# Patient Record
Sex: Female | Born: 1940
Health system: Southern US, Community
[De-identification: ages and names within clinical notes are randomized; demographics above are authoritative.]

## PROBLEM LIST (undated history)

## (undated) DIAGNOSIS — R0602 Shortness of breath: Secondary | ICD-10-CM

## (undated) DIAGNOSIS — E785 Hyperlipidemia, unspecified: Secondary | ICD-10-CM

## (undated) DIAGNOSIS — G4700 Insomnia, unspecified: Secondary | ICD-10-CM

## (undated) DIAGNOSIS — I10 Essential (primary) hypertension: Secondary | ICD-10-CM

## (undated) DIAGNOSIS — R0989 Other specified symptoms and signs involving the circulatory and respiratory systems: Secondary | ICD-10-CM

## (undated) DIAGNOSIS — R928 Other abnormal and inconclusive findings on diagnostic imaging of breast: Secondary | ICD-10-CM

## (undated) DIAGNOSIS — Z87898 Personal history of other specified conditions: Secondary | ICD-10-CM

## (undated) DIAGNOSIS — K219 Gastro-esophageal reflux disease without esophagitis: Secondary | ICD-10-CM

## (undated) DIAGNOSIS — M47812 Spondylosis without myelopathy or radiculopathy, cervical region: Secondary | ICD-10-CM

## (undated) DIAGNOSIS — N3281 Overactive bladder: Secondary | ICD-10-CM

## (undated) DIAGNOSIS — R12 Heartburn: Secondary | ICD-10-CM

## (undated) DIAGNOSIS — J45909 Unspecified asthma, uncomplicated: Secondary | ICD-10-CM

## (undated) DIAGNOSIS — Z9109 Other allergy status, other than to drugs and biological substances: Secondary | ICD-10-CM

## (undated) HISTORY — DX: Insomnia, unspecified: G47.00

## (undated) HISTORY — DX: Other abnormal and inconclusive findings on diagnostic imaging of breast: R92.8

## (undated) HISTORY — DX: Personal history of other specified conditions: Z87.898

## (undated) HISTORY — DX: Hyperlipidemia, unspecified: E78.5

## (undated) HISTORY — DX: Shortness of breath: R06.02

## (undated) HISTORY — DX: Spondylosis without myelopathy or radiculopathy, cervical region: M47.812

## (undated) HISTORY — PX: CATARACT EXTRACTION W/ INTRAOCULAR LENS  IMPLANT, BILATERAL: SHX1307

## (undated) HISTORY — DX: Essential (primary) hypertension: I10

## (undated) HISTORY — PX: FINGER SURGERY: SHX640

## (undated) HISTORY — DX: Gastro-esophageal reflux disease without esophagitis: K21.9

## (undated) HISTORY — DX: Other specified symptoms and signs involving the circulatory and respiratory systems: R09.89

## (undated) HISTORY — DX: Heartburn: R12

## (undated) HISTORY — PX: NASAL SINUS SURGERY: SHX719

## (undated) HISTORY — PX: COLONOSCOPY: SHX174

## (undated) HISTORY — PX: OTHER SURGICAL HISTORY: SHX169

---

## 1999-05-30 ENCOUNTER — Encounter: Admission: RE | Admit: 1999-05-30 | Discharge: 1999-05-30 | Payer: Self-pay | Admitting: Internal Medicine

## 1999-05-30 ENCOUNTER — Encounter: Payer: Self-pay | Admitting: Internal Medicine

## 2000-05-13 ENCOUNTER — Encounter: Admission: RE | Admit: 2000-05-13 | Discharge: 2000-05-13 | Payer: Self-pay | Admitting: Internal Medicine

## 2000-05-13 ENCOUNTER — Encounter: Payer: Self-pay | Admitting: Internal Medicine

## 2001-05-14 ENCOUNTER — Encounter: Admission: RE | Admit: 2001-05-14 | Discharge: 2001-05-14 | Payer: Self-pay | Admitting: Internal Medicine

## 2001-05-14 ENCOUNTER — Encounter: Payer: Self-pay | Admitting: Internal Medicine

## 2001-05-26 DIAGNOSIS — G459 Transient cerebral ischemic attack, unspecified: Secondary | ICD-10-CM

## 2001-05-26 HISTORY — DX: Transient cerebral ischemic attack, unspecified: G45.9

## 2002-09-28 ENCOUNTER — Encounter: Payer: Self-pay | Admitting: Internal Medicine

## 2002-09-28 ENCOUNTER — Encounter: Admission: RE | Admit: 2002-09-28 | Discharge: 2002-09-28 | Payer: Self-pay | Admitting: Internal Medicine

## 2005-03-20 ENCOUNTER — Encounter (HOSPITAL_COMMUNITY): Admission: RE | Admit: 2005-03-20 | Discharge: 2005-03-25 | Payer: Self-pay | Admitting: *Deleted

## 2009-05-26 DIAGNOSIS — R928 Other abnormal and inconclusive findings on diagnostic imaging of breast: Secondary | ICD-10-CM

## 2009-05-26 HISTORY — DX: Other abnormal and inconclusive findings on diagnostic imaging of breast: R92.8

## 2011-07-15 DIAGNOSIS — E559 Vitamin D deficiency, unspecified: Secondary | ICD-10-CM | POA: Diagnosis not present

## 2011-07-15 DIAGNOSIS — Z0000 Encounter for general adult medical examination without abnormal findings: Secondary | ICD-10-CM | POA: Diagnosis not present

## 2011-07-15 DIAGNOSIS — N949 Unspecified condition associated with female genital organs and menstrual cycle: Secondary | ICD-10-CM | POA: Diagnosis not present

## 2011-07-15 DIAGNOSIS — E782 Mixed hyperlipidemia: Secondary | ICD-10-CM | POA: Diagnosis not present

## 2011-07-15 DIAGNOSIS — K219 Gastro-esophageal reflux disease without esophagitis: Secondary | ICD-10-CM | POA: Diagnosis not present

## 2011-07-15 DIAGNOSIS — G459 Transient cerebral ischemic attack, unspecified: Secondary | ICD-10-CM | POA: Diagnosis not present

## 2011-07-15 DIAGNOSIS — K5900 Constipation, unspecified: Secondary | ICD-10-CM | POA: Diagnosis not present

## 2011-07-15 DIAGNOSIS — I10 Essential (primary) hypertension: Secondary | ICD-10-CM | POA: Diagnosis not present

## 2011-07-15 DIAGNOSIS — Z79899 Other long term (current) drug therapy: Secondary | ICD-10-CM | POA: Diagnosis not present

## 2011-09-26 DIAGNOSIS — H40019 Open angle with borderline findings, low risk, unspecified eye: Secondary | ICD-10-CM | POA: Diagnosis not present

## 2011-09-26 DIAGNOSIS — H2589 Other age-related cataract: Secondary | ICD-10-CM | POA: Diagnosis not present

## 2011-10-02 DIAGNOSIS — N649 Disorder of breast, unspecified: Secondary | ICD-10-CM | POA: Diagnosis not present

## 2011-10-27 DIAGNOSIS — Z124 Encounter for screening for malignant neoplasm of cervix: Secondary | ICD-10-CM | POA: Diagnosis not present

## 2011-10-27 DIAGNOSIS — W57XXXA Bitten or stung by nonvenomous insect and other nonvenomous arthropods, initial encounter: Secondary | ICD-10-CM | POA: Diagnosis not present

## 2011-10-27 DIAGNOSIS — N951 Menopausal and female climacteric states: Secondary | ICD-10-CM | POA: Diagnosis not present

## 2011-10-27 DIAGNOSIS — Z1212 Encounter for screening for malignant neoplasm of rectum: Secondary | ICD-10-CM | POA: Diagnosis not present

## 2011-10-27 DIAGNOSIS — T148 Other injury of unspecified body region: Secondary | ICD-10-CM | POA: Diagnosis not present

## 2011-10-27 DIAGNOSIS — N8111 Cystocele, midline: Secondary | ICD-10-CM | POA: Diagnosis not present

## 2011-12-17 DIAGNOSIS — H2589 Other age-related cataract: Secondary | ICD-10-CM | POA: Diagnosis not present

## 2011-12-25 DIAGNOSIS — H40009 Preglaucoma, unspecified, unspecified eye: Secondary | ICD-10-CM | POA: Diagnosis not present

## 2011-12-25 DIAGNOSIS — Z8673 Personal history of transient ischemic attack (TIA), and cerebral infarction without residual deficits: Secondary | ICD-10-CM | POA: Diagnosis not present

## 2011-12-25 DIAGNOSIS — H269 Unspecified cataract: Secondary | ICD-10-CM | POA: Diagnosis not present

## 2011-12-25 DIAGNOSIS — I10 Essential (primary) hypertension: Secondary | ICD-10-CM | POA: Diagnosis not present

## 2011-12-25 DIAGNOSIS — H5210 Myopia, unspecified eye: Secondary | ICD-10-CM | POA: Diagnosis not present

## 2011-12-25 DIAGNOSIS — H52209 Unspecified astigmatism, unspecified eye: Secondary | ICD-10-CM | POA: Diagnosis not present

## 2011-12-25 DIAGNOSIS — H2589 Other age-related cataract: Secondary | ICD-10-CM | POA: Diagnosis not present

## 2012-01-02 DIAGNOSIS — H2589 Other age-related cataract: Secondary | ICD-10-CM | POA: Diagnosis not present

## 2012-01-08 DIAGNOSIS — H2589 Other age-related cataract: Secondary | ICD-10-CM | POA: Diagnosis not present

## 2012-01-08 DIAGNOSIS — H269 Unspecified cataract: Secondary | ICD-10-CM | POA: Diagnosis not present

## 2012-01-08 DIAGNOSIS — Z961 Presence of intraocular lens: Secondary | ICD-10-CM | POA: Diagnosis not present

## 2012-01-08 DIAGNOSIS — E785 Hyperlipidemia, unspecified: Secondary | ICD-10-CM | POA: Diagnosis not present

## 2012-01-08 DIAGNOSIS — I10 Essential (primary) hypertension: Secondary | ICD-10-CM | POA: Diagnosis not present

## 2012-01-08 DIAGNOSIS — Z9849 Cataract extraction status, unspecified eye: Secondary | ICD-10-CM | POA: Diagnosis not present

## 2012-01-08 DIAGNOSIS — Z8673 Personal history of transient ischemic attack (TIA), and cerebral infarction without residual deficits: Secondary | ICD-10-CM | POA: Diagnosis not present

## 2012-02-01 DIAGNOSIS — W010XXA Fall on same level from slipping, tripping and stumbling without subsequent striking against object, initial encounter: Secondary | ICD-10-CM | POA: Diagnosis not present

## 2012-02-01 DIAGNOSIS — S42309A Unspecified fracture of shaft of humerus, unspecified arm, initial encounter for closed fracture: Secondary | ICD-10-CM | POA: Diagnosis not present

## 2012-02-01 DIAGNOSIS — S42209A Unspecified fracture of upper end of unspecified humerus, initial encounter for closed fracture: Secondary | ICD-10-CM | POA: Diagnosis not present

## 2012-02-01 DIAGNOSIS — S4980XA Other specified injuries of shoulder and upper arm, unspecified arm, initial encounter: Secondary | ICD-10-CM | POA: Diagnosis not present

## 2012-02-02 DIAGNOSIS — S42209A Unspecified fracture of upper end of unspecified humerus, initial encounter for closed fracture: Secondary | ICD-10-CM | POA: Diagnosis not present

## 2012-02-06 DIAGNOSIS — M25529 Pain in unspecified elbow: Secondary | ICD-10-CM | POA: Diagnosis not present

## 2012-02-06 DIAGNOSIS — S42209A Unspecified fracture of upper end of unspecified humerus, initial encounter for closed fracture: Secondary | ICD-10-CM | POA: Diagnosis not present

## 2012-02-11 DIAGNOSIS — M25529 Pain in unspecified elbow: Secondary | ICD-10-CM | POA: Diagnosis not present

## 2012-02-13 DIAGNOSIS — M25529 Pain in unspecified elbow: Secondary | ICD-10-CM | POA: Diagnosis not present

## 2012-02-17 DIAGNOSIS — M25529 Pain in unspecified elbow: Secondary | ICD-10-CM | POA: Diagnosis not present

## 2012-02-17 DIAGNOSIS — S42209A Unspecified fracture of upper end of unspecified humerus, initial encounter for closed fracture: Secondary | ICD-10-CM | POA: Diagnosis not present

## 2012-02-19 DIAGNOSIS — M25529 Pain in unspecified elbow: Secondary | ICD-10-CM | POA: Diagnosis not present

## 2012-02-24 DIAGNOSIS — M25529 Pain in unspecified elbow: Secondary | ICD-10-CM | POA: Diagnosis not present

## 2012-02-26 DIAGNOSIS — M25529 Pain in unspecified elbow: Secondary | ICD-10-CM | POA: Diagnosis not present

## 2012-03-02 DIAGNOSIS — M25529 Pain in unspecified elbow: Secondary | ICD-10-CM | POA: Diagnosis not present

## 2012-03-04 DIAGNOSIS — M25529 Pain in unspecified elbow: Secondary | ICD-10-CM | POA: Diagnosis not present

## 2012-03-09 DIAGNOSIS — M25529 Pain in unspecified elbow: Secondary | ICD-10-CM | POA: Diagnosis not present

## 2012-03-12 DIAGNOSIS — M25529 Pain in unspecified elbow: Secondary | ICD-10-CM | POA: Diagnosis not present

## 2012-03-15 DIAGNOSIS — S42213A Unspecified displaced fracture of surgical neck of unspecified humerus, initial encounter for closed fracture: Secondary | ICD-10-CM | POA: Diagnosis not present

## 2012-03-16 DIAGNOSIS — M25529 Pain in unspecified elbow: Secondary | ICD-10-CM | POA: Diagnosis not present

## 2012-03-19 DIAGNOSIS — M25529 Pain in unspecified elbow: Secondary | ICD-10-CM | POA: Diagnosis not present

## 2012-03-23 DIAGNOSIS — M25529 Pain in unspecified elbow: Secondary | ICD-10-CM | POA: Diagnosis not present

## 2012-03-24 DIAGNOSIS — M25529 Pain in unspecified elbow: Secondary | ICD-10-CM | POA: Diagnosis not present

## 2012-03-26 DIAGNOSIS — M25529 Pain in unspecified elbow: Secondary | ICD-10-CM | POA: Diagnosis not present

## 2012-03-29 DIAGNOSIS — M25529 Pain in unspecified elbow: Secondary | ICD-10-CM | POA: Diagnosis not present

## 2012-03-31 DIAGNOSIS — S42213A Unspecified displaced fracture of surgical neck of unspecified humerus, initial encounter for closed fracture: Secondary | ICD-10-CM | POA: Diagnosis not present

## 2012-03-31 DIAGNOSIS — M25529 Pain in unspecified elbow: Secondary | ICD-10-CM | POA: Diagnosis not present

## 2012-04-02 DIAGNOSIS — S42213A Unspecified displaced fracture of surgical neck of unspecified humerus, initial encounter for closed fracture: Secondary | ICD-10-CM | POA: Diagnosis not present

## 2012-04-02 DIAGNOSIS — M25529 Pain in unspecified elbow: Secondary | ICD-10-CM | POA: Diagnosis not present

## 2012-04-05 DIAGNOSIS — S42213A Unspecified displaced fracture of surgical neck of unspecified humerus, initial encounter for closed fracture: Secondary | ICD-10-CM | POA: Diagnosis not present

## 2012-04-05 DIAGNOSIS — M25529 Pain in unspecified elbow: Secondary | ICD-10-CM | POA: Diagnosis not present

## 2012-04-06 DIAGNOSIS — M25529 Pain in unspecified elbow: Secondary | ICD-10-CM | POA: Diagnosis not present

## 2012-04-06 DIAGNOSIS — S42213A Unspecified displaced fracture of surgical neck of unspecified humerus, initial encounter for closed fracture: Secondary | ICD-10-CM | POA: Diagnosis not present

## 2012-04-06 DIAGNOSIS — N63 Unspecified lump in unspecified breast: Secondary | ICD-10-CM | POA: Diagnosis not present

## 2012-04-08 DIAGNOSIS — S42213A Unspecified displaced fracture of surgical neck of unspecified humerus, initial encounter for closed fracture: Secondary | ICD-10-CM | POA: Diagnosis not present

## 2012-04-08 DIAGNOSIS — M25529 Pain in unspecified elbow: Secondary | ICD-10-CM | POA: Diagnosis not present

## 2012-04-12 DIAGNOSIS — R928 Other abnormal and inconclusive findings on diagnostic imaging of breast: Secondary | ICD-10-CM | POA: Diagnosis not present

## 2012-04-12 DIAGNOSIS — N63 Unspecified lump in unspecified breast: Secondary | ICD-10-CM | POA: Diagnosis not present

## 2012-04-12 DIAGNOSIS — S42213A Unspecified displaced fracture of surgical neck of unspecified humerus, initial encounter for closed fracture: Secondary | ICD-10-CM | POA: Diagnosis not present

## 2012-04-12 DIAGNOSIS — Z803 Family history of malignant neoplasm of breast: Secondary | ICD-10-CM | POA: Diagnosis not present

## 2012-04-12 DIAGNOSIS — M25529 Pain in unspecified elbow: Secondary | ICD-10-CM | POA: Diagnosis not present

## 2012-04-14 DIAGNOSIS — M25529 Pain in unspecified elbow: Secondary | ICD-10-CM | POA: Diagnosis not present

## 2012-04-14 DIAGNOSIS — S42213A Unspecified displaced fracture of surgical neck of unspecified humerus, initial encounter for closed fracture: Secondary | ICD-10-CM | POA: Diagnosis not present

## 2012-04-16 DIAGNOSIS — S42213A Unspecified displaced fracture of surgical neck of unspecified humerus, initial encounter for closed fracture: Secondary | ICD-10-CM | POA: Diagnosis not present

## 2012-04-16 DIAGNOSIS — Z23 Encounter for immunization: Secondary | ICD-10-CM | POA: Diagnosis not present

## 2012-04-16 DIAGNOSIS — M25529 Pain in unspecified elbow: Secondary | ICD-10-CM | POA: Diagnosis not present

## 2012-04-19 DIAGNOSIS — S42209A Unspecified fracture of upper end of unspecified humerus, initial encounter for closed fracture: Secondary | ICD-10-CM | POA: Diagnosis not present

## 2012-04-20 DIAGNOSIS — M25529 Pain in unspecified elbow: Secondary | ICD-10-CM | POA: Diagnosis not present

## 2012-04-20 DIAGNOSIS — S42213A Unspecified displaced fracture of surgical neck of unspecified humerus, initial encounter for closed fracture: Secondary | ICD-10-CM | POA: Diagnosis not present

## 2012-04-27 DIAGNOSIS — M25529 Pain in unspecified elbow: Secondary | ICD-10-CM | POA: Diagnosis not present

## 2012-04-27 DIAGNOSIS — S42213A Unspecified displaced fracture of surgical neck of unspecified humerus, initial encounter for closed fracture: Secondary | ICD-10-CM | POA: Diagnosis not present

## 2012-04-29 DIAGNOSIS — S42213A Unspecified displaced fracture of surgical neck of unspecified humerus, initial encounter for closed fracture: Secondary | ICD-10-CM | POA: Diagnosis not present

## 2012-04-29 DIAGNOSIS — M25529 Pain in unspecified elbow: Secondary | ICD-10-CM | POA: Diagnosis not present

## 2012-05-04 DIAGNOSIS — S42213A Unspecified displaced fracture of surgical neck of unspecified humerus, initial encounter for closed fracture: Secondary | ICD-10-CM | POA: Diagnosis not present

## 2012-05-04 DIAGNOSIS — M25529 Pain in unspecified elbow: Secondary | ICD-10-CM | POA: Diagnosis not present

## 2012-05-06 DIAGNOSIS — S42213A Unspecified displaced fracture of surgical neck of unspecified humerus, initial encounter for closed fracture: Secondary | ICD-10-CM | POA: Diagnosis not present

## 2012-05-06 DIAGNOSIS — M25529 Pain in unspecified elbow: Secondary | ICD-10-CM | POA: Diagnosis not present

## 2012-05-11 DIAGNOSIS — M25529 Pain in unspecified elbow: Secondary | ICD-10-CM | POA: Diagnosis not present

## 2012-05-11 DIAGNOSIS — S42213A Unspecified displaced fracture of surgical neck of unspecified humerus, initial encounter for closed fracture: Secondary | ICD-10-CM | POA: Diagnosis not present

## 2012-05-14 DIAGNOSIS — M25529 Pain in unspecified elbow: Secondary | ICD-10-CM | POA: Diagnosis not present

## 2012-05-14 DIAGNOSIS — S42213A Unspecified displaced fracture of surgical neck of unspecified humerus, initial encounter for closed fracture: Secondary | ICD-10-CM | POA: Diagnosis not present

## 2012-06-09 DIAGNOSIS — Z961 Presence of intraocular lens: Secondary | ICD-10-CM | POA: Diagnosis not present

## 2012-07-16 ENCOUNTER — Other Ambulatory Visit: Payer: Self-pay | Admitting: Internal Medicine

## 2012-07-16 DIAGNOSIS — R439 Unspecified disturbances of smell and taste: Secondary | ICD-10-CM | POA: Diagnosis not present

## 2012-07-16 DIAGNOSIS — Z0000 Encounter for general adult medical examination without abnormal findings: Secondary | ICD-10-CM | POA: Diagnosis not present

## 2012-07-16 DIAGNOSIS — R1084 Generalized abdominal pain: Secondary | ICD-10-CM | POA: Diagnosis not present

## 2012-07-16 DIAGNOSIS — Z79899 Other long term (current) drug therapy: Secondary | ICD-10-CM | POA: Diagnosis not present

## 2012-07-16 DIAGNOSIS — E782 Mixed hyperlipidemia: Secondary | ICD-10-CM | POA: Diagnosis not present

## 2012-07-16 DIAGNOSIS — K5900 Constipation, unspecified: Secondary | ICD-10-CM | POA: Diagnosis not present

## 2012-07-16 DIAGNOSIS — Z1331 Encounter for screening for depression: Secondary | ICD-10-CM | POA: Diagnosis not present

## 2012-07-16 DIAGNOSIS — I10 Essential (primary) hypertension: Secondary | ICD-10-CM | POA: Diagnosis not present

## 2012-07-16 DIAGNOSIS — E559 Vitamin D deficiency, unspecified: Secondary | ICD-10-CM | POA: Diagnosis not present

## 2012-07-16 DIAGNOSIS — G459 Transient cerebral ischemic attack, unspecified: Secondary | ICD-10-CM | POA: Diagnosis not present

## 2012-07-21 ENCOUNTER — Ambulatory Visit
Admission: RE | Admit: 2012-07-21 | Discharge: 2012-07-21 | Disposition: A | Discharge status: Home / Self Care | Payer: Medicare Other | Source: Ambulatory Visit | Attending: Internal Medicine | Admitting: Internal Medicine

## 2012-07-21 DIAGNOSIS — R109 Unspecified abdominal pain: Secondary | ICD-10-CM | POA: Diagnosis not present

## 2012-07-21 MED ORDER — IOHEXOL 300 MG/ML  SOLN
100.0000 mL | Freq: Once | INTRAMUSCULAR | Status: AC | PRN
  Administered 2012-07-21: 100 mL via INTRAVENOUS

## 2012-08-06 DIAGNOSIS — S42213A Unspecified displaced fracture of surgical neck of unspecified humerus, initial encounter for closed fracture: Secondary | ICD-10-CM | POA: Diagnosis not present

## 2012-08-16 DIAGNOSIS — R12 Heartburn: Secondary | ICD-10-CM | POA: Diagnosis not present

## 2012-08-16 DIAGNOSIS — I10 Essential (primary) hypertension: Secondary | ICD-10-CM | POA: Diagnosis not present

## 2012-08-16 DIAGNOSIS — R109 Unspecified abdominal pain: Secondary | ICD-10-CM | POA: Diagnosis not present

## 2012-08-31 DIAGNOSIS — D485 Neoplasm of uncertain behavior of skin: Secondary | ICD-10-CM | POA: Diagnosis not present

## 2012-09-14 DIAGNOSIS — D485 Neoplasm of uncertain behavior of skin: Secondary | ICD-10-CM | POA: Diagnosis not present

## 2012-10-01 DIAGNOSIS — D485 Neoplasm of uncertain behavior of skin: Secondary | ICD-10-CM | POA: Diagnosis not present

## 2012-11-04 DIAGNOSIS — G4700 Insomnia, unspecified: Secondary | ICD-10-CM | POA: Diagnosis not present

## 2012-11-04 DIAGNOSIS — Z13820 Encounter for screening for osteoporosis: Secondary | ICD-10-CM | POA: Diagnosis not present

## 2012-11-04 DIAGNOSIS — Z1212 Encounter for screening for malignant neoplasm of rectum: Secondary | ICD-10-CM | POA: Diagnosis not present

## 2012-11-04 DIAGNOSIS — L708 Other acne: Secondary | ICD-10-CM | POA: Diagnosis not present

## 2012-11-04 DIAGNOSIS — N951 Menopausal and female climacteric states: Secondary | ICD-10-CM | POA: Diagnosis not present

## 2012-11-04 DIAGNOSIS — N8111 Cystocele, midline: Secondary | ICD-10-CM | POA: Diagnosis not present

## 2012-11-04 DIAGNOSIS — Z01419 Encounter for gynecological examination (general) (routine) without abnormal findings: Secondary | ICD-10-CM | POA: Diagnosis not present

## 2013-03-09 DIAGNOSIS — Z23 Encounter for immunization: Secondary | ICD-10-CM | POA: Diagnosis not present

## 2013-05-23 DIAGNOSIS — J3489 Other specified disorders of nose and nasal sinuses: Secondary | ICD-10-CM | POA: Diagnosis not present

## 2013-05-23 DIAGNOSIS — H6120 Impacted cerumen, unspecified ear: Secondary | ICD-10-CM | POA: Diagnosis not present

## 2013-05-23 DIAGNOSIS — Z1231 Encounter for screening mammogram for malignant neoplasm of breast: Secondary | ICD-10-CM | POA: Diagnosis not present

## 2013-05-26 DIAGNOSIS — R0989 Other specified symptoms and signs involving the circulatory and respiratory systems: Secondary | ICD-10-CM

## 2013-05-26 HISTORY — DX: Other specified symptoms and signs involving the circulatory and respiratory systems: R09.89

## 2013-06-10 DIAGNOSIS — H40019 Open angle with borderline findings, low risk, unspecified eye: Secondary | ICD-10-CM | POA: Diagnosis not present

## 2013-06-10 DIAGNOSIS — B399 Histoplasmosis, unspecified: Secondary | ICD-10-CM | POA: Diagnosis not present

## 2013-06-10 DIAGNOSIS — H04129 Dry eye syndrome of unspecified lacrimal gland: Secondary | ICD-10-CM | POA: Diagnosis not present

## 2013-07-18 ENCOUNTER — Other Ambulatory Visit: Payer: Self-pay | Admitting: Internal Medicine

## 2013-07-18 DIAGNOSIS — R0989 Other specified symptoms and signs involving the circulatory and respiratory systems: Secondary | ICD-10-CM | POA: Diagnosis not present

## 2013-07-18 DIAGNOSIS — E782 Mixed hyperlipidemia: Secondary | ICD-10-CM | POA: Diagnosis not present

## 2013-07-18 DIAGNOSIS — E559 Vitamin D deficiency, unspecified: Secondary | ICD-10-CM | POA: Diagnosis not present

## 2013-07-18 DIAGNOSIS — Z1331 Encounter for screening for depression: Secondary | ICD-10-CM | POA: Diagnosis not present

## 2013-07-18 DIAGNOSIS — Z0000 Encounter for general adult medical examination without abnormal findings: Secondary | ICD-10-CM

## 2013-07-18 DIAGNOSIS — R12 Heartburn: Secondary | ICD-10-CM | POA: Diagnosis not present

## 2013-07-18 DIAGNOSIS — Z79899 Other long term (current) drug therapy: Secondary | ICD-10-CM | POA: Diagnosis not present

## 2013-07-18 DIAGNOSIS — I10 Essential (primary) hypertension: Secondary | ICD-10-CM | POA: Diagnosis not present

## 2013-07-18 DIAGNOSIS — R439 Unspecified disturbances of smell and taste: Secondary | ICD-10-CM | POA: Diagnosis not present

## 2013-07-22 ENCOUNTER — Other Ambulatory Visit: Payer: Medicare Other

## 2013-07-27 ENCOUNTER — Ambulatory Visit
Admission: RE | Admit: 2013-07-27 | Discharge: 2013-07-27 | Disposition: A | Discharge status: Home / Self Care | Payer: Medicare Other | Source: Ambulatory Visit | Attending: Internal Medicine | Admitting: Internal Medicine

## 2013-07-27 DIAGNOSIS — Z0000 Encounter for general adult medical examination without abnormal findings: Secondary | ICD-10-CM

## 2013-07-27 DIAGNOSIS — I658 Occlusion and stenosis of other precerebral arteries: Secondary | ICD-10-CM | POA: Diagnosis not present

## 2013-11-14 ENCOUNTER — Ambulatory Visit (INDEPENDENT_AMBULATORY_CARE_PROVIDER_SITE_OTHER): Payer: Medicare Other | Admitting: Cardiology

## 2013-11-14 ENCOUNTER — Encounter: Payer: Self-pay | Admitting: Cardiology

## 2013-11-14 VITALS — BP 131/70 | HR 56 | Ht 65.0 in | Wt 175.4 lb

## 2013-11-14 DIAGNOSIS — E785 Hyperlipidemia, unspecified: Secondary | ICD-10-CM | POA: Diagnosis not present

## 2013-11-14 DIAGNOSIS — R0602 Shortness of breath: Secondary | ICD-10-CM | POA: Diagnosis not present

## 2013-11-14 DIAGNOSIS — IMO0001 Reserved for inherently not codable concepts without codable children: Secondary | ICD-10-CM | POA: Diagnosis not present

## 2013-11-14 DIAGNOSIS — Z124 Encounter for screening for malignant neoplasm of cervix: Secondary | ICD-10-CM | POA: Diagnosis not present

## 2013-11-14 DIAGNOSIS — N3941 Urge incontinence: Secondary | ICD-10-CM | POA: Diagnosis not present

## 2013-11-14 DIAGNOSIS — I10 Essential (primary) hypertension: Secondary | ICD-10-CM | POA: Diagnosis not present

## 2013-11-14 DIAGNOSIS — Z1212 Encounter for screening for malignant neoplasm of rectum: Secondary | ICD-10-CM | POA: Diagnosis not present

## 2013-11-14 DIAGNOSIS — N951 Menopausal and female climacteric states: Secondary | ICD-10-CM | POA: Diagnosis not present

## 2013-11-14 DIAGNOSIS — I208 Other forms of angina pectoris: Secondary | ICD-10-CM

## 2013-11-14 DIAGNOSIS — R0989 Other specified symptoms and signs involving the circulatory and respiratory systems: Secondary | ICD-10-CM | POA: Diagnosis not present

## 2013-11-14 DIAGNOSIS — R6889 Other general symptoms and signs: Secondary | ICD-10-CM | POA: Diagnosis not present

## 2013-11-14 DIAGNOSIS — R0609 Other forms of dyspnea: Secondary | ICD-10-CM

## 2013-11-14 DIAGNOSIS — R0600 Dyspnea, unspecified: Secondary | ICD-10-CM

## 2013-11-14 MED ORDER — ATORVASTATIN CALCIUM 40 MG PO TABS: 40.0000 mg | ORAL_TABLET | Freq: Every day | ORAL | Status: DC

## 2013-11-14 NOTE — Progress Notes (Signed)
[facility]. [address], [place]  [address] Phone: [phone] Fax:  [phone]  Date:  11/14/2013   ID:  Holly Cruz, DOB Oct 22, 1940, MRN [id number]  PCP:  No primary provider on file.   History of Present Illness: Holly Cruz is a 73 y.o. female patient of Dr. Inda Merlin with new onset shortness of breath, decreased exercise tolerance and symptoms concerning for possible angina. In 2006 she underwent nuclear stress test that was low risk, no ischemia. She has over the past month been experiencing increasing dyspnea, decreased exercise tolerance, worsening fatigue. After the gym had no energy. Weak. Walk across the floor weak. Friday night her left arm hurt really bad, pain, upper arm, 4 hours, diaphoresis. Near the end radiated to right arm. Went away. Last night at 11pm, both arms hurt. Pain. 2 hours.  In fact, she was at her gynecology appointment this morning.  This concerned her gynecologist enough to notify Dr. Inda Merlin.   Wt Readings from Last 3 Encounters:  11/14/13 175 lb 6.4 oz (79.561 kg)     Past Medical History  Diagnosis Date  . Hyperlipidemia   . Hypertension   . GERD (gastroesophageal reflux disease)   . DJD (degenerative joint disease), cervical     severe muscle spasm  . SOB (shortness of breath)     in past with Neg. cardiac workup with normal myocardial perfusion scan in 02-2005 per Dr. Tamala Julian.  . Insomnia   . Abnormal mammogram 2011  . Hx of abdominal pain     as per history of present illness gross hematuria with negative workup per Dr. Risa Grill 2010- no resurrence  through January 2001  . Heart burn   . Left carotid bruit 2015    Past Surgical History  Procedure Laterality Date  . Finger surgery      Right 3rd  and 4th finger- trigger finger release  . Carotid doppler      revealed a less than 50% stenosis on the right, none on the left 07-27-2013    Current Outpatient Prescriptions  Medication Sig Dispense Refill  . amLODipine  (NORVASC) 5 MG tablet Take 5 mg by mouth daily.      Marland Kitchen aspirin 325 MG tablet Take 160 mg by mouth daily.       Marland Kitchen atenolol (TENORMIN) 50 MG tablet Take 50 mg by mouth daily.      . Cholecalciferol (VITAMIN D PO) Take 2,000 mg by mouth daily.      . Multiple Vitamin (MULTIVITAMIN) capsule Take 1 capsule by mouth daily.      Marland Kitchen omeprazole (PRILOSEC) 20 MG capsule Take 20 mg by mouth as needed.       . simvastatin (ZOCOR) 80 MG tablet Take 80 mg by mouth daily.      Marland Kitchen telmisartan-hydrochlorothiazide (MICARDIS HCT) 80-12.5 MG per tablet Take 1 tablet by mouth daily.      . zaleplon (SONATA) 10 MG capsule Take 10 mg by mouth 2 (two) times daily as needed for sleep (for Insomnia).       No current facility-administered medications for this visit.    Allergies:    Allergies  Allergen Reactions  . Codeine     Nausea and vomiting  . Naprosyn [Naproxen]     Some form of severe reaction  . Sulfa Antibiotics     rash  . Tetanus Toxoids     Rash    Social History:  The patient  reports that she has never smoked. She does not have any smokeless tobacco history on file. 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   Family History  Problem Relation Age of Onset  . Hypertension Mother   . Heart disease Mother   . Prostate cancer Father   . Hypertension Father    Mother CABG at 58. Father died of CVA.   ROS:  Please see the history of present illness.   Denies any fevers, chills, orthopnea, PND, syncope, bleeding, orthopnea.  All other systems reviewed and negative.   PHYSICAL EXAM: VS:  BP 131/70  Pulse 56  Ht 5\' 5"  (1.651 m)  Wt 175 lb 6.4 oz (79.561 kg)  BMI 29.19 kg/m2 Well nourished, well developed, in no acute distress HEENT: normal, [place]/AT, EOMI Neck: no JVD, normal carotid upstroke, no bruit Cardiac:  normal S1, S2; RRR; no murmur Lungs:  clear to auscultation bilaterally, no wheezing, rhonchi or rales Abd: soft, nontender, no hepatomegaly, no bruits Ext: no edema,  2+ distal pulses Skin: warm and dry GU: deferred Neuro: no focal abnormalities noted, AAO x 3  EKG:  11/14/13-sinus bradycardia 54with nonspecific ST-T wave changes  ASSESSMENT AND PLAN:  1. Dyspnea/angina-possible anginal equivalent. With associated jaw pain, we will go ahead and order a nuclear stress test to further evaluate for ischemia. Dr. Inda Merlin today has ordered lab work including troponin, d-dimer and will followup with this. TSH was also performed. Differential diagnosis includes musculoskeletal, possible ischemia. 2. Hyperlipidemia-given her concomitant use of simvastatin 80 and amlodipine, I will change her simvastatin over to atorvastatin 40 mg. She should tolerate well. 3. Hypertension-on multidrug regimen. 4. I will followup with testing  Signed, Candee Furbish, MD [facility]  11/14/2013 2:52 PM

## 2013-11-14 NOTE — Patient Instructions (Signed)
STOP SIMVASTATIN   START ATORVASTATIN 40 MG DAILY   Your physician has requested that you have en exercise stress myoview. For further information please visit [email or web address]. Please follow instruction sheet, as given.

## 2013-11-16 ENCOUNTER — Ambulatory Visit (HOSPITAL_COMMUNITY): Payer: Medicare Other | Attending: Cardiology | Admitting: Radiology

## 2013-11-16 VITALS — BP 159/73 | HR 65 | Ht 65.0 in | Wt 178.0 lb

## 2013-11-16 DIAGNOSIS — R079 Chest pain, unspecified: Secondary | ICD-10-CM | POA: Diagnosis not present

## 2013-11-16 DIAGNOSIS — R0600 Dyspnea, unspecified: Secondary | ICD-10-CM

## 2013-11-16 DIAGNOSIS — R5381 Other malaise: Secondary | ICD-10-CM | POA: Insufficient documentation

## 2013-11-16 DIAGNOSIS — R0602 Shortness of breath: Secondary | ICD-10-CM | POA: Diagnosis not present

## 2013-11-16 DIAGNOSIS — R61 Generalized hyperhidrosis: Secondary | ICD-10-CM | POA: Insufficient documentation

## 2013-11-16 DIAGNOSIS — M79609 Pain in unspecified limb: Secondary | ICD-10-CM | POA: Insufficient documentation

## 2013-11-16 DIAGNOSIS — R5383 Other fatigue: Principal | ICD-10-CM

## 2013-11-16 MED ORDER — TECHNETIUM TC 99M SESTAMIBI GENERIC - CARDIOLITE
11.0000 | Freq: Once | INTRAVENOUS | Status: AC | PRN
  Administered 2013-11-16: 11 via INTRAVENOUS

## 2013-11-16 MED ORDER — TECHNETIUM TC 99M SESTAMIBI GENERIC - CARDIOLITE
33.0000 | Freq: Once | INTRAVENOUS | Status: AC | PRN
  Administered 2013-11-16: 33 via INTRAVENOUS

## 2013-11-16 NOTE — Progress Notes (Signed)
[facility] [place], [place] [address] [phone]    Cardiology Nuclear Med Study  Holly Cruz is a 73 y.o. female     MRN : [id number]     DOB: Nov 27, 1940  Procedure Date: 11/16/2013  Nuclear Med Background Indication for Stress Test:  Evaluation for Ischemia and Abnormal EKG History:  No known CAD, MPI 2006 (low risk) Cardiac Risk Factors: Carotid Disease, Family History - CAD, Hypertension, Lipids and TIA  Symptoms:  Fatigue, SOB and left arm pain with diaphoresis   Nuclear Pre-Procedure Caffeine/Decaff Intake:  None NPO After: 7:00am   Lungs:  clear O2 Sat: 98% on room air. IV 0.9% NS with Angio Cath:  22g  IV Site: R Antecubital  IV Started by:  Crissie Figures, RN  Chest Size (in):  38 Cup Size: C  Height: 5\' 5"  (1.651 m)  Weight:  178 lb (80.74 kg)  BMI:  Body mass index is 29.62 kg/(m^2). Tech Comments:  Atenolol held x 24 hrs    Nuclear Med Study 1 or 2 day study: 1 day  Stress Test Type:  Stress  Reading MD: N/A  Order Authorizing Provider:  Candee Furbish, MD  Resting Radionuclide: Technetium 61m Sestamibi  Resting Radionuclide Dose: 11.0 mCi   Stress Radionuclide:  Technetium 71m Sestamibi  Stress Radionuclide Dose: 33.0 mCi           Stress Protocol Rest HR: 65 Stress HR: 134  Rest BP: 159/73 Stress BP: 204/78  Exercise Time (min): 6:00 METS: 7.0           Dose of Adenosine (mg):  n/a Dose of Lexiscan: n/a mg  Dose of Atropine (mg): n/a Dose of Dobutamine: n/a mcg/kg/min (at max HR)  Stress Test Technologist: Glade Lloyd, BS-ES  Nuclear Technologist:  Charlton Amor, CNMT     Rest Procedure:  Myocardial perfusion imaging was performed at rest 45 minutes following the intravenous administration of Technetium 34m Sestamibi. Rest ECG: NSR - Normal EKG  Stress Procedure:  The patient exercised on the treadmill utilizing the Bruce Protocol for 6:00 minutes. The patient stopped due to fatigue, SOB and denied  any chest pain.  Technetium 65m Sestamibi was injected at peak exercise and myocardial perfusion imaging was performed after a brief delay. Stress ECG: No significant ST segment change suggestive of ischemia.  QPS Raw Data Images:  Normal; no motion artifact; normal heart/lung ratio. Stress Images:  There is decreased uptake in the lateral wall. Rest Images:  Decreased uptake in the lateral wall and apex Subtraction (SDS):  No evidence of ischemia. Transient Ischemic Dilatation (Normal <1.22):  0.87 Lung/Heart Ratio (Normal <0.45):  0.31  Quantitative Gated Spect Images QGS EDV:  88 ml QGS ESV:  23 ml  Impression Exercise Capacity:  Good exercise capacity. BP Response:  Hypertensive blood pressure response. Clinical Symptoms:  There is dyspnea. ECG Impression:  No significant ST segment change suggestive of ischemia. Comparison with Prior Nuclear Study: No previous nuclear study performed  Overall Impression:  Low risk stress nuclear study without reversible ischemia.  LV Ejection Fraction: 74%.  LV Wall Motion:  NL LV Function; NL Wall Motion  Pixie Casino, MD, [facility] Board Certified in Nuclear Cardiology Attending Cardiologist [place]

## 2013-11-21 ENCOUNTER — Encounter: Payer: Self-pay | Admitting: Cardiology

## 2013-11-21 ENCOUNTER — Telehealth: Payer: Self-pay | Admitting: *Deleted

## 2013-11-21 NOTE — Telephone Encounter (Signed)
Notified of myoview results.  Will send copy to Dr. Inda Merlin.  Advised to contact him for SOB and weakness.

## 2013-11-21 NOTE — Telephone Encounter (Signed)
Called pt from "my chart" request for lab results.  Notified that Dr. Marlou Porch has not reviewed her Holly Cruz results but someone will call her when have been read. States she had episode last night with heart pounding and became very sweaty.  States this lasted less than a minute.  She says she is still SOB and can't walk for any distance.  Advised will forward to Dr. Marlou Porch for him to review myoview.

## 2013-11-21 NOTE — Telephone Encounter (Signed)
Please see result from nuclear stress test. Low risk, no ischemia, normal EF. Reassurance. Please make sure that Dr. Inda Merlin gets a copy of results.

## 2014-01-23 DIAGNOSIS — L820 Inflamed seborrheic keratosis: Secondary | ICD-10-CM | POA: Diagnosis not present

## 2014-01-23 DIAGNOSIS — D239 Other benign neoplasm of skin, unspecified: Secondary | ICD-10-CM | POA: Diagnosis not present

## 2014-02-15 IMAGING — CT CT ABD-PELV W/ CM
3 of 5 series · 13 of 36 positions shown, 19 images · IV contrast (OMNI 300/WATER & [ID] OMNI 300)
Comparison: CT urogram of 11/10/2008

CLINICAL DATA: Mid to lower abdominal pain for 1 year, history of
normal colonoscopy and endoscopy over last few years

CT ABDOMEN AND PELVIS WITH CONTRAST
TECHNIQUE: Multidetector CT imaging of the abdomen and pelvis was
performed following the standard protocol during bolus
administration of intravenous contrast.
Contrast: 100mL OMNIPAQUE IOHEXOL 300 MG/ML  SOLN

[Series 3: abd/pelvis with · axial · 0.70mm/px · z∈[-414,-80]mm · 8 of 86 slices shown, 13 images]
[im 10/86  soft-tissue]
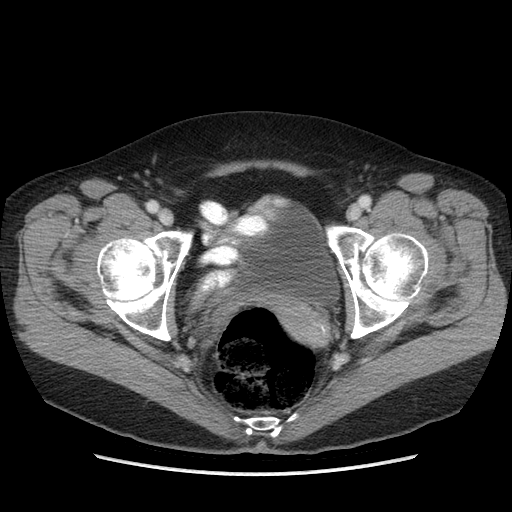
[im 10/86  bone]
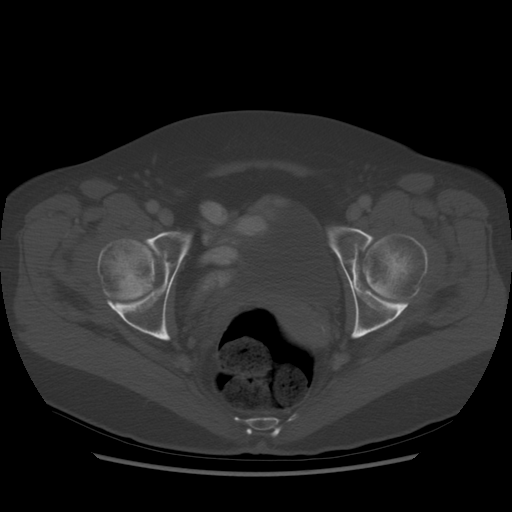
[im 19/86  soft-tissue]
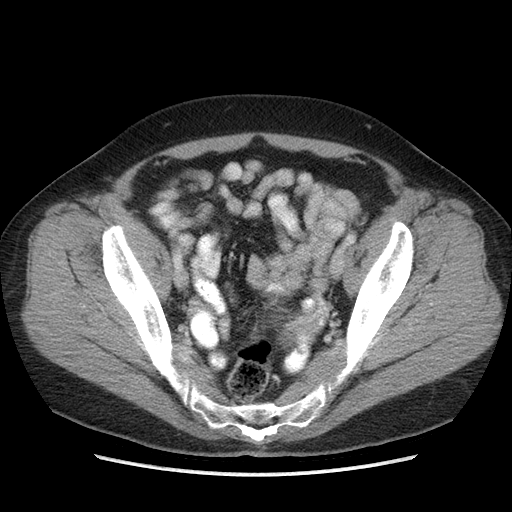
[im 29/86  soft-tissue]
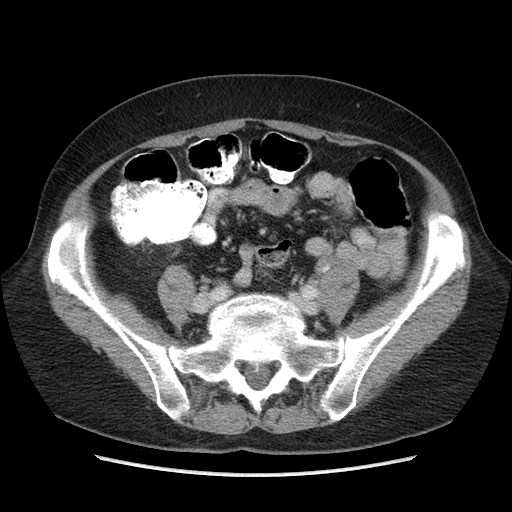
[im 38/86  soft-tissue]
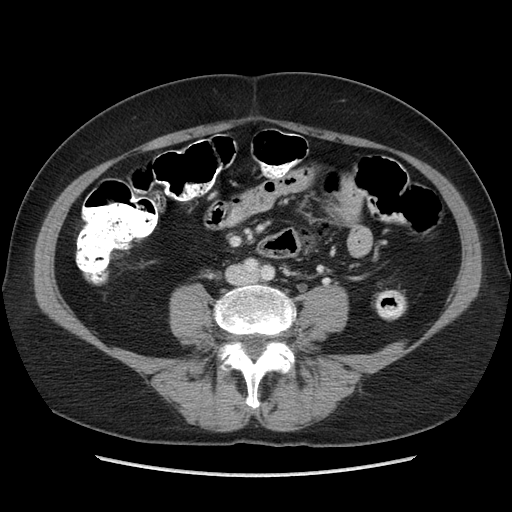
[im 48/86  soft-tissue]
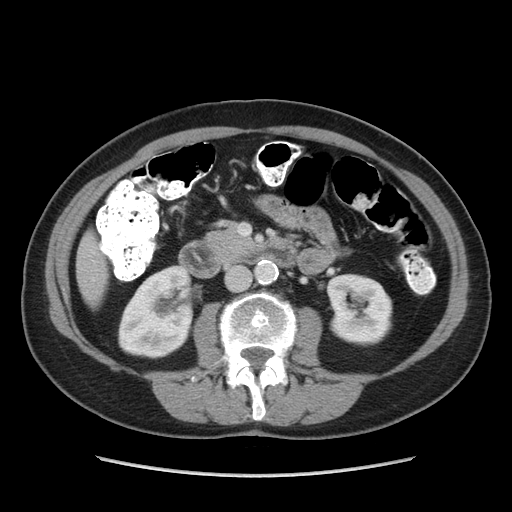
[im 48/86  lung]
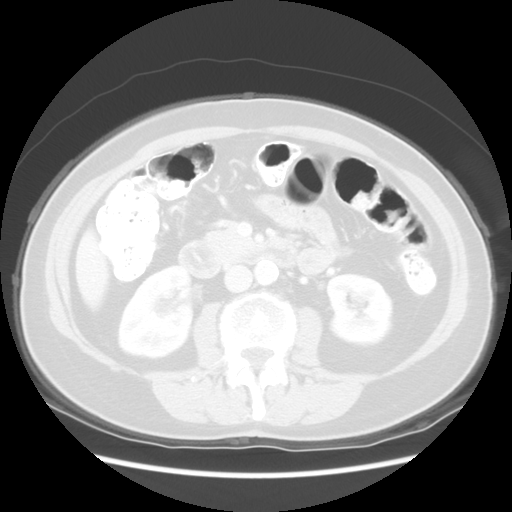
[im 57/86  soft-tissue]
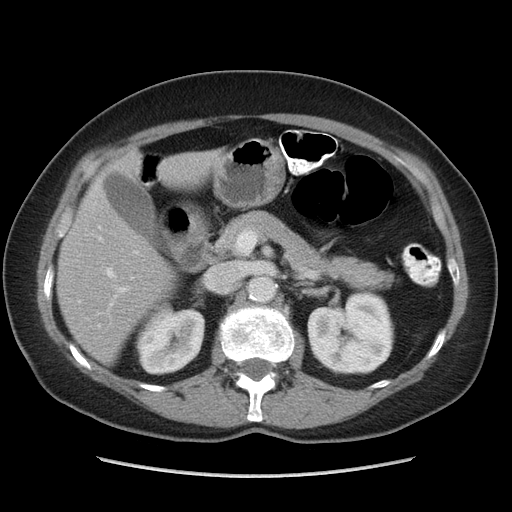
[im 57/86  lung]
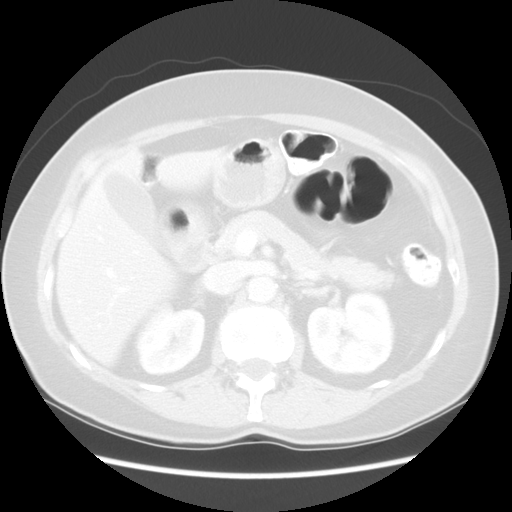
[im 67/86  soft-tissue]
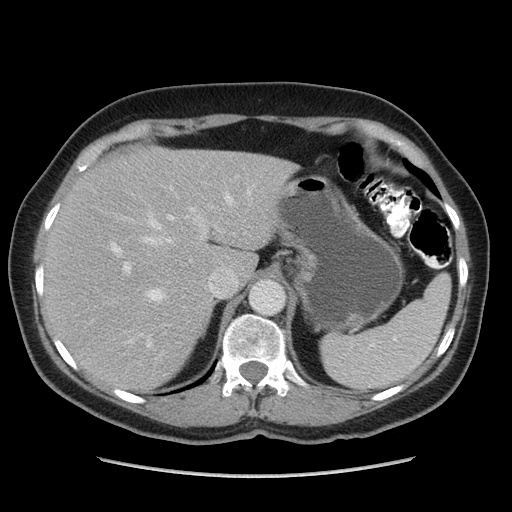
[im 67/86  lung]
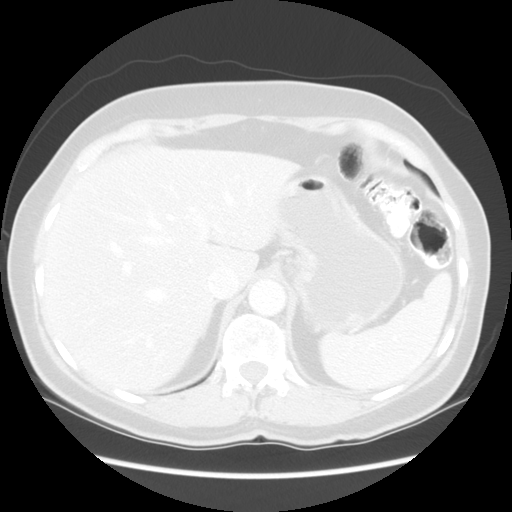
[im 76/86  soft-tissue]
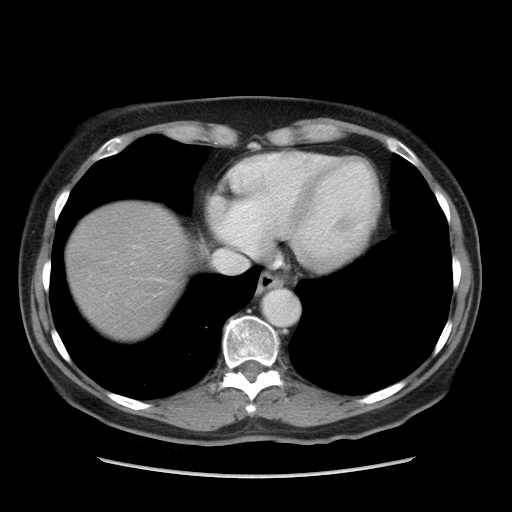
[im 76/86  lung]
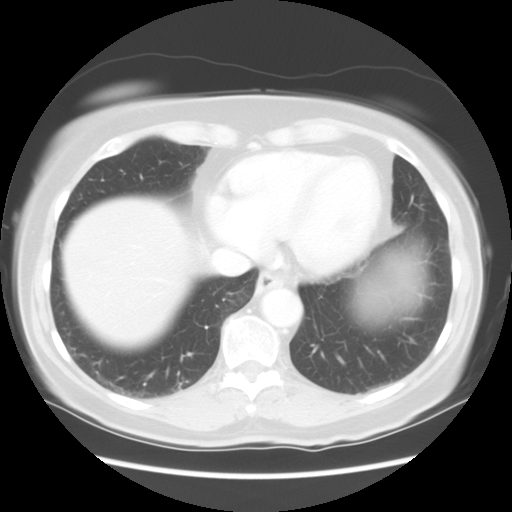

[Series 601: coronal body · coronal · 0.92mm/px · 1 of 117 slices shown, 2 images]
[im 39/117  soft-tissue]
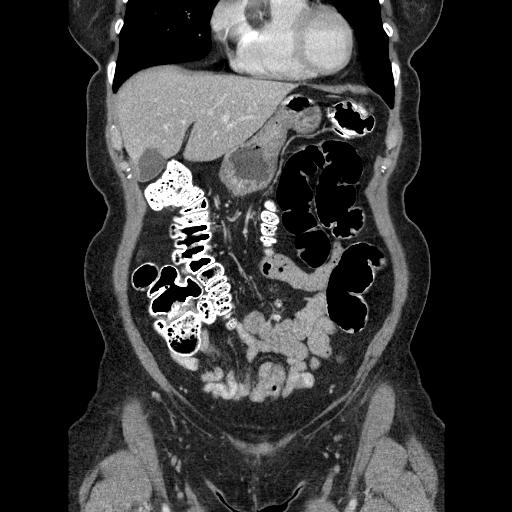
[im 39/117  bone]
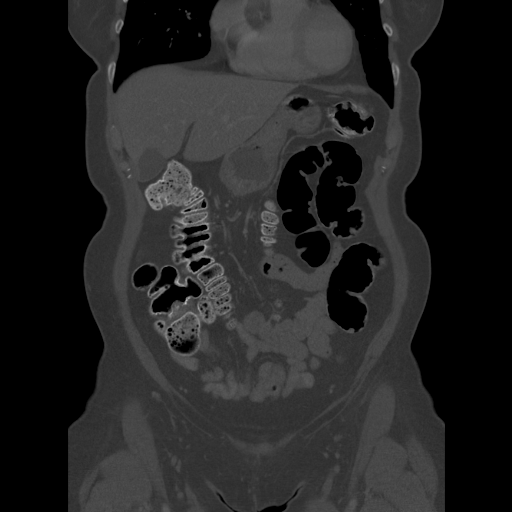

[Series 602: sagittal body · sagittal · 0.92mm/px · 4 of 145 slices shown]
[im 10/145  soft-tissue]
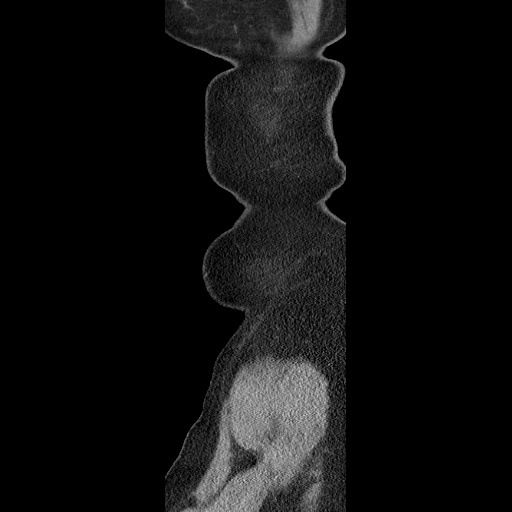
[im 29/145  soft-tissue]
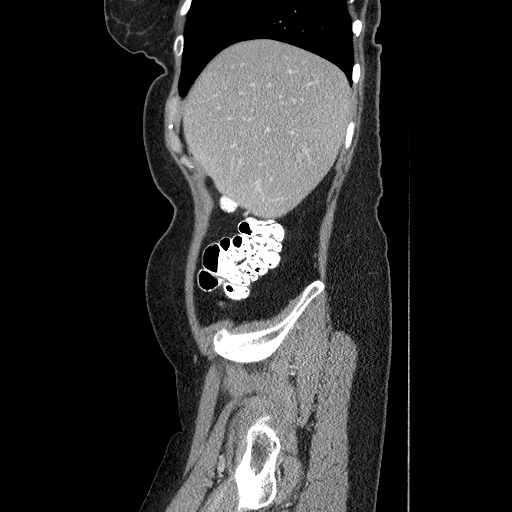
[im 49/145  soft-tissue]
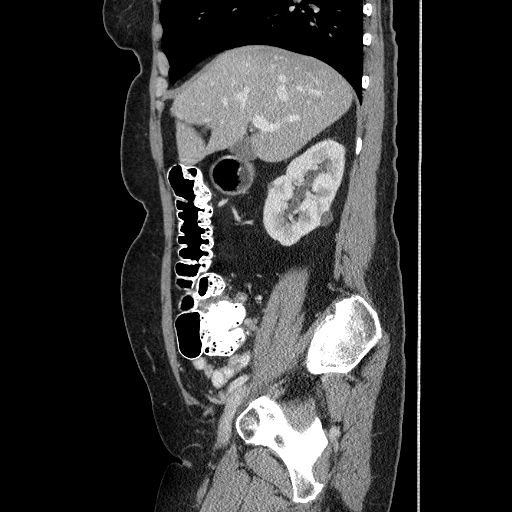
[im 68/145  soft-tissue]
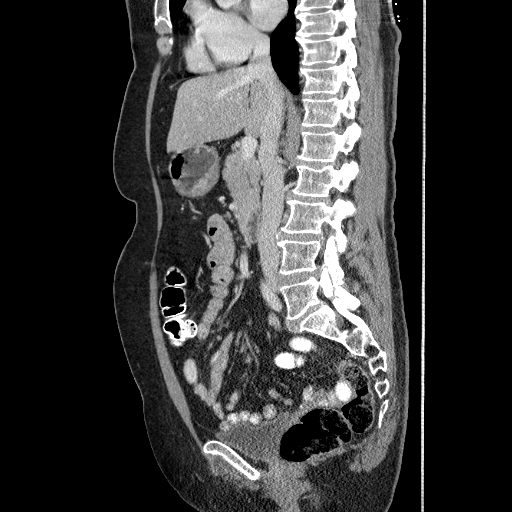

[13 of 36 positions shown; findings below may reference images not displayed]

FINDINGS: The lung bases are clear.  The heart is mildly enlarged.
The liver enhances with no focal abnormality and no ductal
dilatation is seen.  There may be very mild fatty infiltration the
liver, with some sparing near the gallbladder.  No calcified
gallstones are seen.  The pancreas is normal in size and the
pancreatic duct is not dilated.  The adrenal glands and spleen are
unremarkable.  The stomach is moderately fluid distended with no
significant abnormality noted.  The kidneys enhance with no
calculus or mass. Two small right renal cysts are noted of no more
than 12 mm in diameter.  On delayed images, the pelvocaliceal
systems are unremarkable. The abdominal aorta is normal in caliber.
No adenopathy is seen.

The uterus is normal in size.  No adnexal lesion is seen.  No fluid
is noted within the pelvis.  The urinary bladder is not well
distended but no abnormality is seen.  No abnormality of the colon
is seen.  The terminal ileum is unremarkable.  The appendix is
faintly visualized, better seen on coronal images, with no
abnormality noted.  There are bilateral pars defects at L5 with
normal alignment.
IMPRESSION: 1.  No significant abnormality on CT of the abdomen and pelvis.
2.  The appendix and terminal ileum are unremarkable.
3.  Bilateral pars defects at L5 with normal alignment.

## 2014-04-03 DIAGNOSIS — Z23 Encounter for immunization: Secondary | ICD-10-CM | POA: Diagnosis not present

## 2014-04-10 DIAGNOSIS — R0789 Other chest pain: Secondary | ICD-10-CM | POA: Diagnosis not present

## 2014-05-08 ENCOUNTER — Other Ambulatory Visit: Payer: Self-pay | Admitting: Cardiology

## 2014-05-22 DIAGNOSIS — I10 Essential (primary) hypertension: Secondary | ICD-10-CM | POA: Diagnosis not present

## 2014-05-22 DIAGNOSIS — J40 Bronchitis, not specified as acute or chronic: Secondary | ICD-10-CM | POA: Diagnosis not present

## 2014-05-22 DIAGNOSIS — M79659 Pain in unspecified thigh: Secondary | ICD-10-CM | POA: Diagnosis not present

## 2014-05-22 DIAGNOSIS — E785 Hyperlipidemia, unspecified: Secondary | ICD-10-CM | POA: Diagnosis not present

## 2014-05-24 DIAGNOSIS — Z1231 Encounter for screening mammogram for malignant neoplasm of breast: Secondary | ICD-10-CM | POA: Diagnosis not present

## 2014-06-15 DIAGNOSIS — D485 Neoplasm of uncertain behavior of skin: Secondary | ICD-10-CM | POA: Diagnosis not present

## 2014-06-15 DIAGNOSIS — L820 Inflamed seborrheic keratosis: Secondary | ICD-10-CM | POA: Diagnosis not present

## 2014-07-21 DIAGNOSIS — R0789 Other chest pain: Secondary | ICD-10-CM | POA: Diagnosis not present

## 2014-07-21 DIAGNOSIS — Z1389 Encounter for screening for other disorder: Secondary | ICD-10-CM | POA: Diagnosis not present

## 2014-07-21 DIAGNOSIS — R438 Other disturbances of smell and taste: Secondary | ICD-10-CM | POA: Diagnosis not present

## 2014-07-21 DIAGNOSIS — G459 Transient cerebral ischemic attack, unspecified: Secondary | ICD-10-CM | POA: Diagnosis not present

## 2014-07-21 DIAGNOSIS — Z0001 Encounter for general adult medical examination with abnormal findings: Secondary | ICD-10-CM | POA: Diagnosis not present

## 2014-07-21 DIAGNOSIS — K219 Gastro-esophageal reflux disease without esophagitis: Secondary | ICD-10-CM | POA: Diagnosis not present

## 2014-07-21 DIAGNOSIS — E559 Vitamin D deficiency, unspecified: Secondary | ICD-10-CM | POA: Diagnosis not present

## 2014-07-21 DIAGNOSIS — Z23 Encounter for immunization: Secondary | ICD-10-CM | POA: Diagnosis not present

## 2014-07-21 DIAGNOSIS — R0989 Other specified symptoms and signs involving the circulatory and respiratory systems: Secondary | ICD-10-CM | POA: Diagnosis not present

## 2014-07-21 DIAGNOSIS — E785 Hyperlipidemia, unspecified: Secondary | ICD-10-CM | POA: Diagnosis not present

## 2014-07-21 DIAGNOSIS — Z79899 Other long term (current) drug therapy: Secondary | ICD-10-CM | POA: Diagnosis not present

## 2014-07-21 DIAGNOSIS — I10 Essential (primary) hypertension: Secondary | ICD-10-CM | POA: Diagnosis not present

## 2014-09-06 DIAGNOSIS — Z961 Presence of intraocular lens: Secondary | ICD-10-CM | POA: Diagnosis not present

## 2014-09-06 DIAGNOSIS — H40013 Open angle with borderline findings, low risk, bilateral: Secondary | ICD-10-CM | POA: Diagnosis not present

## 2014-11-16 DIAGNOSIS — R195 Other fecal abnormalities: Secondary | ICD-10-CM | POA: Diagnosis not present

## 2014-11-16 DIAGNOSIS — N8111 Cystocele, midline: Secondary | ICD-10-CM | POA: Diagnosis not present

## 2014-11-16 DIAGNOSIS — N951 Menopausal and female climacteric states: Secondary | ICD-10-CM | POA: Diagnosis not present

## 2014-11-16 DIAGNOSIS — Z01419 Encounter for gynecological examination (general) (routine) without abnormal findings: Secondary | ICD-10-CM | POA: Diagnosis not present

## 2014-11-16 DIAGNOSIS — Z1212 Encounter for screening for malignant neoplasm of rectum: Secondary | ICD-10-CM | POA: Diagnosis not present

## 2014-11-16 DIAGNOSIS — R8290 Unspecified abnormal findings in urine: Secondary | ICD-10-CM | POA: Diagnosis not present

## 2014-11-16 DIAGNOSIS — R809 Proteinuria, unspecified: Secondary | ICD-10-CM | POA: Diagnosis not present

## 2014-11-16 DIAGNOSIS — R312 Other microscopic hematuria: Secondary | ICD-10-CM | POA: Diagnosis not present

## 2014-11-20 ENCOUNTER — Other Ambulatory Visit: Payer: Self-pay

## 2014-12-12 DIAGNOSIS — R195 Other fecal abnormalities: Secondary | ICD-10-CM | POA: Diagnosis not present

## 2014-12-12 DIAGNOSIS — Z09 Encounter for follow-up examination after completed treatment for conditions other than malignant neoplasm: Secondary | ICD-10-CM | POA: Diagnosis not present

## 2015-03-14 DIAGNOSIS — L309 Dermatitis, unspecified: Secondary | ICD-10-CM | POA: Diagnosis not present

## 2015-04-25 DIAGNOSIS — Z23 Encounter for immunization: Secondary | ICD-10-CM | POA: Diagnosis not present

## 2015-06-08 DIAGNOSIS — J209 Acute bronchitis, unspecified: Secondary | ICD-10-CM | POA: Diagnosis not present

## 2015-06-29 DIAGNOSIS — Z1231 Encounter for screening mammogram for malignant neoplasm of breast: Secondary | ICD-10-CM | POA: Diagnosis not present

## 2015-07-03 DIAGNOSIS — N63 Unspecified lump in breast: Secondary | ICD-10-CM | POA: Diagnosis not present

## 2015-10-10 DIAGNOSIS — Z0001 Encounter for general adult medical examination with abnormal findings: Secondary | ICD-10-CM | POA: Diagnosis not present

## 2015-10-10 DIAGNOSIS — Z1389 Encounter for screening for other disorder: Secondary | ICD-10-CM | POA: Diagnosis not present

## 2015-10-10 DIAGNOSIS — E559 Vitamin D deficiency, unspecified: Secondary | ICD-10-CM | POA: Diagnosis not present

## 2015-10-10 DIAGNOSIS — E785 Hyperlipidemia, unspecified: Secondary | ICD-10-CM | POA: Diagnosis not present

## 2015-10-10 DIAGNOSIS — K219 Gastro-esophageal reflux disease without esophagitis: Secondary | ICD-10-CM | POA: Diagnosis not present

## 2015-10-10 DIAGNOSIS — R0989 Other specified symptoms and signs involving the circulatory and respiratory systems: Secondary | ICD-10-CM | POA: Diagnosis not present

## 2015-10-10 DIAGNOSIS — Z79899 Other long term (current) drug therapy: Secondary | ICD-10-CM | POA: Diagnosis not present

## 2015-10-10 DIAGNOSIS — H40013 Open angle with borderline findings, low risk, bilateral: Secondary | ICD-10-CM | POA: Diagnosis not present

## 2015-10-10 DIAGNOSIS — I10 Essential (primary) hypertension: Secondary | ICD-10-CM | POA: Diagnosis not present

## 2015-10-10 DIAGNOSIS — R1310 Dysphagia, unspecified: Secondary | ICD-10-CM | POA: Diagnosis not present

## 2015-10-10 DIAGNOSIS — Z961 Presence of intraocular lens: Secondary | ICD-10-CM | POA: Diagnosis not present

## 2015-11-26 DIAGNOSIS — L700 Acne vulgaris: Secondary | ICD-10-CM | POA: Diagnosis not present

## 2015-11-26 DIAGNOSIS — Z124 Encounter for screening for malignant neoplasm of cervix: Secondary | ICD-10-CM | POA: Diagnosis not present

## 2015-11-26 DIAGNOSIS — K644 Residual hemorrhoidal skin tags: Secondary | ICD-10-CM | POA: Diagnosis not present

## 2015-11-26 DIAGNOSIS — N8111 Cystocele, midline: Secondary | ICD-10-CM | POA: Diagnosis not present

## 2015-11-26 DIAGNOSIS — R195 Other fecal abnormalities: Secondary | ICD-10-CM | POA: Diagnosis not present

## 2015-11-26 DIAGNOSIS — Z780 Asymptomatic menopausal state: Secondary | ICD-10-CM | POA: Diagnosis not present

## 2015-12-31 DIAGNOSIS — R922 Inconclusive mammogram: Secondary | ICD-10-CM | POA: Diagnosis not present

## 2016-03-17 DIAGNOSIS — Z23 Encounter for immunization: Secondary | ICD-10-CM | POA: Diagnosis not present

## 2016-05-30 DIAGNOSIS — R0981 Nasal congestion: Secondary | ICD-10-CM | POA: Diagnosis not present

## 2016-05-30 DIAGNOSIS — R52 Pain, unspecified: Secondary | ICD-10-CM | POA: Diagnosis not present

## 2016-05-30 DIAGNOSIS — R509 Fever, unspecified: Secondary | ICD-10-CM | POA: Diagnosis not present

## 2016-06-30 DIAGNOSIS — R922 Inconclusive mammogram: Secondary | ICD-10-CM | POA: Diagnosis not present

## 2016-10-24 DIAGNOSIS — H04123 Dry eye syndrome of bilateral lacrimal glands: Secondary | ICD-10-CM | POA: Diagnosis not present

## 2016-10-24 DIAGNOSIS — D485 Neoplasm of uncertain behavior of skin: Secondary | ICD-10-CM | POA: Diagnosis not present

## 2016-10-24 DIAGNOSIS — D3131 Benign neoplasm of right choroid: Secondary | ICD-10-CM | POA: Diagnosis not present

## 2016-10-24 DIAGNOSIS — H40013 Open angle with borderline findings, low risk, bilateral: Secondary | ICD-10-CM | POA: Diagnosis not present

## 2016-10-29 ENCOUNTER — Other Ambulatory Visit: Payer: Self-pay | Admitting: Internal Medicine

## 2016-10-29 DIAGNOSIS — I10 Essential (primary) hypertension: Secondary | ICD-10-CM | POA: Diagnosis not present

## 2016-10-29 DIAGNOSIS — Z1389 Encounter for screening for other disorder: Secondary | ICD-10-CM | POA: Diagnosis not present

## 2016-10-29 DIAGNOSIS — G459 Transient cerebral ischemic attack, unspecified: Secondary | ICD-10-CM | POA: Diagnosis not present

## 2016-10-29 DIAGNOSIS — R0989 Other specified symptoms and signs involving the circulatory and respiratory systems: Secondary | ICD-10-CM | POA: Diagnosis not present

## 2016-10-29 DIAGNOSIS — R1310 Dysphagia, unspecified: Secondary | ICD-10-CM | POA: Diagnosis not present

## 2016-10-29 DIAGNOSIS — E559 Vitamin D deficiency, unspecified: Secondary | ICD-10-CM | POA: Diagnosis not present

## 2016-10-29 DIAGNOSIS — F439 Reaction to severe stress, unspecified: Secondary | ICD-10-CM | POA: Diagnosis not present

## 2016-10-29 DIAGNOSIS — Z79899 Other long term (current) drug therapy: Secondary | ICD-10-CM | POA: Diagnosis not present

## 2016-10-29 DIAGNOSIS — Z0000 Encounter for general adult medical examination without abnormal findings: Secondary | ICD-10-CM | POA: Diagnosis not present

## 2016-10-29 DIAGNOSIS — K219 Gastro-esophageal reflux disease without esophagitis: Secondary | ICD-10-CM | POA: Diagnosis not present

## 2016-10-29 DIAGNOSIS — G479 Sleep disorder, unspecified: Secondary | ICD-10-CM | POA: Diagnosis not present

## 2016-10-29 DIAGNOSIS — E785 Hyperlipidemia, unspecified: Secondary | ICD-10-CM | POA: Diagnosis not present

## 2016-11-28 ENCOUNTER — Ambulatory Visit
Admission: RE | Admit: 2016-11-28 | Discharge: 2016-11-28 | Disposition: A | Discharge status: Home / Self Care | Payer: Medicare Other | Source: Ambulatory Visit | Attending: Internal Medicine | Admitting: Internal Medicine

## 2016-11-28 DIAGNOSIS — K219 Gastro-esophageal reflux disease without esophagitis: Secondary | ICD-10-CM | POA: Diagnosis not present

## 2016-11-28 DIAGNOSIS — R1310 Dysphagia, unspecified: Secondary | ICD-10-CM

## 2016-12-01 DIAGNOSIS — N3941 Urge incontinence: Secondary | ICD-10-CM | POA: Diagnosis not present

## 2016-12-01 DIAGNOSIS — K644 Residual hemorrhoidal skin tags: Secondary | ICD-10-CM | POA: Diagnosis not present

## 2016-12-01 DIAGNOSIS — Z1212 Encounter for screening for malignant neoplasm of rectum: Secondary | ICD-10-CM | POA: Diagnosis not present

## 2016-12-01 DIAGNOSIS — N6009 Solitary cyst of unspecified breast: Secondary | ICD-10-CM | POA: Diagnosis not present

## 2016-12-01 DIAGNOSIS — Z780 Asymptomatic menopausal state: Secondary | ICD-10-CM | POA: Diagnosis not present

## 2016-12-01 DIAGNOSIS — N8111 Cystocele, midline: Secondary | ICD-10-CM | POA: Diagnosis not present

## 2016-12-12 DIAGNOSIS — Z6828 Body mass index (BMI) 28.0-28.9, adult: Secondary | ICD-10-CM | POA: Diagnosis not present

## 2016-12-12 DIAGNOSIS — R062 Wheezing: Secondary | ICD-10-CM | POA: Diagnosis not present

## 2016-12-12 DIAGNOSIS — J209 Acute bronchitis, unspecified: Secondary | ICD-10-CM | POA: Diagnosis not present

## 2016-12-12 DIAGNOSIS — R05 Cough: Secondary | ICD-10-CM | POA: Diagnosis not present

## 2016-12-12 DIAGNOSIS — Z719 Counseling, unspecified: Secondary | ICD-10-CM | POA: Diagnosis not present

## 2016-12-29 DIAGNOSIS — R928 Other abnormal and inconclusive findings on diagnostic imaging of breast: Secondary | ICD-10-CM | POA: Diagnosis not present

## 2017-01-02 DIAGNOSIS — R05 Cough: Secondary | ICD-10-CM | POA: Diagnosis not present

## 2017-02-10 DIAGNOSIS — J45909 Unspecified asthma, uncomplicated: Secondary | ICD-10-CM | POA: Diagnosis not present

## 2017-03-05 DIAGNOSIS — Z23 Encounter for immunization: Secondary | ICD-10-CM | POA: Diagnosis not present

## 2017-03-16 DIAGNOSIS — I10 Essential (primary) hypertension: Secondary | ICD-10-CM | POA: Diagnosis not present

## 2017-03-16 DIAGNOSIS — J45909 Unspecified asthma, uncomplicated: Secondary | ICD-10-CM | POA: Diagnosis not present

## 2017-04-15 DIAGNOSIS — J069 Acute upper respiratory infection, unspecified: Secondary | ICD-10-CM | POA: Diagnosis not present

## 2017-04-28 DIAGNOSIS — E785 Hyperlipidemia, unspecified: Secondary | ICD-10-CM | POA: Diagnosis not present

## 2017-04-28 DIAGNOSIS — G459 Transient cerebral ischemic attack, unspecified: Secondary | ICD-10-CM | POA: Diagnosis not present

## 2017-04-28 DIAGNOSIS — J45909 Unspecified asthma, uncomplicated: Secondary | ICD-10-CM | POA: Diagnosis not present

## 2017-04-28 DIAGNOSIS — I10 Essential (primary) hypertension: Secondary | ICD-10-CM | POA: Diagnosis not present

## 2017-06-16 DIAGNOSIS — J45991 Cough variant asthma: Secondary | ICD-10-CM | POA: Diagnosis not present

## 2017-06-16 DIAGNOSIS — J302 Other seasonal allergic rhinitis: Secondary | ICD-10-CM | POA: Diagnosis not present

## 2017-06-16 DIAGNOSIS — R0902 Hypoxemia: Secondary | ICD-10-CM | POA: Diagnosis not present

## 2017-06-16 DIAGNOSIS — R05 Cough: Secondary | ICD-10-CM | POA: Diagnosis not present

## 2017-06-18 DIAGNOSIS — R0602 Shortness of breath: Secondary | ICD-10-CM | POA: Diagnosis not present

## 2017-07-06 DIAGNOSIS — R922 Inconclusive mammogram: Secondary | ICD-10-CM | POA: Diagnosis not present

## 2017-07-22 DIAGNOSIS — R093 Abnormal sputum: Secondary | ICD-10-CM | POA: Diagnosis not present

## 2017-07-22 DIAGNOSIS — R0902 Hypoxemia: Secondary | ICD-10-CM | POA: Diagnosis not present

## 2017-07-22 DIAGNOSIS — R05 Cough: Secondary | ICD-10-CM | POA: Diagnosis not present

## 2017-07-22 DIAGNOSIS — J302 Other seasonal allergic rhinitis: Secondary | ICD-10-CM | POA: Diagnosis not present

## 2017-07-22 DIAGNOSIS — J471 Bronchiectasis with (acute) exacerbation: Secondary | ICD-10-CM | POA: Diagnosis not present

## 2017-07-22 DIAGNOSIS — J329 Chronic sinusitis, unspecified: Secondary | ICD-10-CM | POA: Diagnosis not present

## 2017-07-24 DIAGNOSIS — J329 Chronic sinusitis, unspecified: Secondary | ICD-10-CM | POA: Diagnosis not present

## 2017-07-24 DIAGNOSIS — J471 Bronchiectasis with (acute) exacerbation: Secondary | ICD-10-CM | POA: Diagnosis not present

## 2017-07-28 DIAGNOSIS — J302 Other seasonal allergic rhinitis: Secondary | ICD-10-CM | POA: Diagnosis not present

## 2017-07-28 DIAGNOSIS — I10 Essential (primary) hypertension: Secondary | ICD-10-CM | POA: Diagnosis not present

## 2017-07-28 DIAGNOSIS — Z8673 Personal history of transient ischemic attack (TIA), and cerebral infarction without residual deficits: Secondary | ICD-10-CM | POA: Diagnosis not present

## 2017-07-28 DIAGNOSIS — J219 Acute bronchiolitis, unspecified: Secondary | ICD-10-CM | POA: Diagnosis not present

## 2017-07-28 DIAGNOSIS — J470 Bronchiectasis with acute lower respiratory infection: Secondary | ICD-10-CM | POA: Diagnosis not present

## 2017-07-28 DIAGNOSIS — J069 Acute upper respiratory infection, unspecified: Secondary | ICD-10-CM | POA: Diagnosis not present

## 2017-08-07 DIAGNOSIS — J479 Bronchiectasis, uncomplicated: Secondary | ICD-10-CM | POA: Diagnosis not present

## 2017-08-07 DIAGNOSIS — J324 Chronic pansinusitis: Secondary | ICD-10-CM | POA: Diagnosis not present

## 2017-08-24 DIAGNOSIS — J479 Bronchiectasis, uncomplicated: Secondary | ICD-10-CM | POA: Diagnosis not present

## 2017-08-24 DIAGNOSIS — R05 Cough: Secondary | ICD-10-CM | POA: Diagnosis not present

## 2017-08-24 DIAGNOSIS — R0902 Hypoxemia: Secondary | ICD-10-CM | POA: Diagnosis not present

## 2017-08-24 DIAGNOSIS — J329 Chronic sinusitis, unspecified: Secondary | ICD-10-CM | POA: Diagnosis not present

## 2017-08-24 DIAGNOSIS — J45991 Cough variant asthma: Secondary | ICD-10-CM | POA: Diagnosis not present

## 2017-08-24 DIAGNOSIS — Z9889 Other specified postprocedural states: Secondary | ICD-10-CM | POA: Diagnosis not present

## 2017-08-24 DIAGNOSIS — R9389 Abnormal findings on diagnostic imaging of other specified body structures: Secondary | ICD-10-CM | POA: Diagnosis not present

## 2017-09-16 DIAGNOSIS — E785 Hyperlipidemia, unspecified: Secondary | ICD-10-CM | POA: Diagnosis not present

## 2017-09-16 DIAGNOSIS — J479 Bronchiectasis, uncomplicated: Secondary | ICD-10-CM | POA: Diagnosis not present

## 2017-09-16 DIAGNOSIS — J324 Chronic pansinusitis: Secondary | ICD-10-CM | POA: Diagnosis not present

## 2017-09-16 DIAGNOSIS — G479 Sleep disorder, unspecified: Secondary | ICD-10-CM | POA: Diagnosis not present

## 2017-09-16 DIAGNOSIS — E559 Vitamin D deficiency, unspecified: Secondary | ICD-10-CM | POA: Diagnosis not present

## 2017-09-16 DIAGNOSIS — K219 Gastro-esophageal reflux disease without esophagitis: Secondary | ICD-10-CM | POA: Diagnosis not present

## 2017-09-16 DIAGNOSIS — F439 Reaction to severe stress, unspecified: Secondary | ICD-10-CM | POA: Diagnosis not present

## 2017-09-16 DIAGNOSIS — G459 Transient cerebral ischemic attack, unspecified: Secondary | ICD-10-CM | POA: Diagnosis not present

## 2017-09-16 DIAGNOSIS — I10 Essential (primary) hypertension: Secondary | ICD-10-CM | POA: Diagnosis not present

## 2017-09-16 DIAGNOSIS — J45909 Unspecified asthma, uncomplicated: Secondary | ICD-10-CM | POA: Diagnosis not present

## 2017-10-20 DIAGNOSIS — I10 Essential (primary) hypertension: Secondary | ICD-10-CM | POA: Diagnosis not present

## 2017-10-20 DIAGNOSIS — Z7982 Long term (current) use of aspirin: Secondary | ICD-10-CM | POA: Diagnosis not present

## 2017-10-20 DIAGNOSIS — E7800 Pure hypercholesterolemia, unspecified: Secondary | ICD-10-CM | POA: Diagnosis not present

## 2017-10-20 DIAGNOSIS — Z8673 Personal history of transient ischemic attack (TIA), and cerebral infarction without residual deficits: Secondary | ICD-10-CM | POA: Diagnosis not present

## 2017-10-20 DIAGNOSIS — J324 Chronic pansinusitis: Secondary | ICD-10-CM | POA: Diagnosis not present

## 2017-10-20 DIAGNOSIS — J479 Bronchiectasis, uncomplicated: Secondary | ICD-10-CM | POA: Diagnosis not present

## 2017-10-20 DIAGNOSIS — K219 Gastro-esophageal reflux disease without esophagitis: Secondary | ICD-10-CM | POA: Diagnosis not present

## 2017-10-20 DIAGNOSIS — J45909 Unspecified asthma, uncomplicated: Secondary | ICD-10-CM | POA: Diagnosis not present

## 2017-10-20 DIAGNOSIS — J338 Other polyp of sinus: Secondary | ICD-10-CM | POA: Diagnosis not present

## 2017-10-27 DIAGNOSIS — H04123 Dry eye syndrome of bilateral lacrimal glands: Secondary | ICD-10-CM | POA: Diagnosis not present

## 2017-10-27 DIAGNOSIS — Z961 Presence of intraocular lens: Secondary | ICD-10-CM | POA: Diagnosis not present

## 2017-10-27 DIAGNOSIS — H40013 Open angle with borderline findings, low risk, bilateral: Secondary | ICD-10-CM | POA: Diagnosis not present

## 2017-10-27 DIAGNOSIS — D3131 Benign neoplasm of right choroid: Secondary | ICD-10-CM | POA: Diagnosis not present

## 2017-10-28 DIAGNOSIS — J338 Other polyp of sinus: Secondary | ICD-10-CM | POA: Diagnosis not present

## 2017-10-28 DIAGNOSIS — E7800 Pure hypercholesterolemia, unspecified: Secondary | ICD-10-CM | POA: Diagnosis not present

## 2017-10-28 DIAGNOSIS — J45909 Unspecified asthma, uncomplicated: Secondary | ICD-10-CM | POA: Diagnosis not present

## 2017-10-28 DIAGNOSIS — J324 Chronic pansinusitis: Secondary | ICD-10-CM | POA: Diagnosis not present

## 2017-10-28 DIAGNOSIS — J479 Bronchiectasis, uncomplicated: Secondary | ICD-10-CM | POA: Diagnosis not present

## 2017-10-28 DIAGNOSIS — J069 Acute upper respiratory infection, unspecified: Secondary | ICD-10-CM | POA: Diagnosis not present

## 2017-10-28 DIAGNOSIS — I10 Essential (primary) hypertension: Secondary | ICD-10-CM | POA: Diagnosis not present

## 2017-11-03 DIAGNOSIS — L659 Nonscarring hair loss, unspecified: Secondary | ICD-10-CM | POA: Diagnosis not present

## 2017-11-03 DIAGNOSIS — Z1211 Encounter for screening for malignant neoplasm of colon: Secondary | ICD-10-CM | POA: Diagnosis not present

## 2017-11-03 DIAGNOSIS — I10 Essential (primary) hypertension: Secondary | ICD-10-CM | POA: Diagnosis not present

## 2017-11-03 DIAGNOSIS — Z0000 Encounter for general adult medical examination without abnormal findings: Secondary | ICD-10-CM | POA: Diagnosis not present

## 2017-11-03 DIAGNOSIS — Z79899 Other long term (current) drug therapy: Secondary | ICD-10-CM | POA: Diagnosis not present

## 2017-11-03 DIAGNOSIS — J479 Bronchiectasis, uncomplicated: Secondary | ICD-10-CM | POA: Diagnosis not present

## 2017-11-03 DIAGNOSIS — F439 Reaction to severe stress, unspecified: Secondary | ICD-10-CM | POA: Diagnosis not present

## 2017-11-03 DIAGNOSIS — G479 Sleep disorder, unspecified: Secondary | ICD-10-CM | POA: Diagnosis not present

## 2017-11-03 DIAGNOSIS — Z1389 Encounter for screening for other disorder: Secondary | ICD-10-CM | POA: Diagnosis not present

## 2017-11-03 DIAGNOSIS — E559 Vitamin D deficiency, unspecified: Secondary | ICD-10-CM | POA: Diagnosis not present

## 2017-11-03 DIAGNOSIS — E785 Hyperlipidemia, unspecified: Secondary | ICD-10-CM | POA: Diagnosis not present

## 2017-11-03 DIAGNOSIS — J324 Chronic pansinusitis: Secondary | ICD-10-CM | POA: Diagnosis not present

## 2017-11-03 DIAGNOSIS — G459 Transient cerebral ischemic attack, unspecified: Secondary | ICD-10-CM | POA: Diagnosis not present

## 2017-11-03 DIAGNOSIS — J45909 Unspecified asthma, uncomplicated: Secondary | ICD-10-CM | POA: Diagnosis not present

## 2017-11-05 DIAGNOSIS — J324 Chronic pansinusitis: Secondary | ICD-10-CM | POA: Diagnosis not present

## 2017-11-23 DIAGNOSIS — J324 Chronic pansinusitis: Secondary | ICD-10-CM | POA: Diagnosis not present

## 2017-12-03 DIAGNOSIS — Z780 Asymptomatic menopausal state: Secondary | ICD-10-CM | POA: Diagnosis not present

## 2017-12-03 DIAGNOSIS — Z124 Encounter for screening for malignant neoplasm of cervix: Secondary | ICD-10-CM | POA: Diagnosis not present

## 2017-12-03 DIAGNOSIS — N8111 Cystocele, midline: Secondary | ICD-10-CM | POA: Diagnosis not present

## 2017-12-03 DIAGNOSIS — Z1212 Encounter for screening for malignant neoplasm of rectum: Secondary | ICD-10-CM | POA: Diagnosis not present

## 2018-01-13 DIAGNOSIS — Z780 Asymptomatic menopausal state: Secondary | ICD-10-CM | POA: Diagnosis not present

## 2018-01-26 DIAGNOSIS — J324 Chronic pansinusitis: Secondary | ICD-10-CM | POA: Diagnosis not present

## 2018-01-26 DIAGNOSIS — J479 Bronchiectasis, uncomplicated: Secondary | ICD-10-CM | POA: Diagnosis not present

## 2018-03-08 DIAGNOSIS — Z23 Encounter for immunization: Secondary | ICD-10-CM | POA: Diagnosis not present

## 2018-04-28 DIAGNOSIS — R05 Cough: Secondary | ICD-10-CM | POA: Diagnosis not present

## 2018-04-28 DIAGNOSIS — J324 Chronic pansinusitis: Secondary | ICD-10-CM | POA: Diagnosis not present

## 2018-04-28 DIAGNOSIS — J479 Bronchiectasis, uncomplicated: Secondary | ICD-10-CM | POA: Diagnosis not present

## 2018-04-28 DIAGNOSIS — J471 Bronchiectasis with (acute) exacerbation: Secondary | ICD-10-CM | POA: Diagnosis not present

## 2018-04-28 DIAGNOSIS — J449 Chronic obstructive pulmonary disease, unspecified: Secondary | ICD-10-CM | POA: Diagnosis not present

## 2018-05-05 DIAGNOSIS — J324 Chronic pansinusitis: Secondary | ICD-10-CM | POA: Diagnosis not present

## 2018-05-05 DIAGNOSIS — J471 Bronchiectasis with (acute) exacerbation: Secondary | ICD-10-CM | POA: Diagnosis not present

## 2018-05-24 DIAGNOSIS — J449 Chronic obstructive pulmonary disease, unspecified: Secondary | ICD-10-CM | POA: Diagnosis not present

## 2018-05-24 DIAGNOSIS — J479 Bronchiectasis, uncomplicated: Secondary | ICD-10-CM | POA: Diagnosis not present

## 2018-05-24 DIAGNOSIS — J302 Other seasonal allergic rhinitis: Secondary | ICD-10-CM | POA: Diagnosis not present

## 2018-05-24 DIAGNOSIS — R0902 Hypoxemia: Secondary | ICD-10-CM | POA: Diagnosis not present

## 2018-06-08 DIAGNOSIS — J3081 Allergic rhinitis due to animal (cat) (dog) hair and dander: Secondary | ICD-10-CM | POA: Diagnosis not present

## 2018-06-08 DIAGNOSIS — J4541 Moderate persistent asthma with (acute) exacerbation: Secondary | ICD-10-CM | POA: Diagnosis not present

## 2018-06-08 DIAGNOSIS — J3089 Other allergic rhinitis: Secondary | ICD-10-CM | POA: Diagnosis not present

## 2018-06-08 DIAGNOSIS — J301 Allergic rhinitis due to pollen: Secondary | ICD-10-CM | POA: Diagnosis not present

## 2018-06-08 DIAGNOSIS — J329 Chronic sinusitis, unspecified: Secondary | ICD-10-CM | POA: Diagnosis not present

## 2018-06-08 DIAGNOSIS — J4540 Moderate persistent asthma, uncomplicated: Secondary | ICD-10-CM | POA: Diagnosis not present

## 2018-06-17 DIAGNOSIS — J3081 Allergic rhinitis due to animal (cat) (dog) hair and dander: Secondary | ICD-10-CM | POA: Diagnosis not present

## 2018-06-17 DIAGNOSIS — J301 Allergic rhinitis due to pollen: Secondary | ICD-10-CM | POA: Diagnosis not present

## 2018-06-17 DIAGNOSIS — J3089 Other allergic rhinitis: Secondary | ICD-10-CM | POA: Diagnosis not present

## 2018-06-18 DIAGNOSIS — J3089 Other allergic rhinitis: Secondary | ICD-10-CM | POA: Diagnosis not present

## 2018-06-18 DIAGNOSIS — J3081 Allergic rhinitis due to animal (cat) (dog) hair and dander: Secondary | ICD-10-CM | POA: Diagnosis not present

## 2018-06-18 DIAGNOSIS — J301 Allergic rhinitis due to pollen: Secondary | ICD-10-CM | POA: Diagnosis not present

## 2018-06-21 DIAGNOSIS — J3089 Other allergic rhinitis: Secondary | ICD-10-CM | POA: Diagnosis not present

## 2018-06-21 DIAGNOSIS — J301 Allergic rhinitis due to pollen: Secondary | ICD-10-CM | POA: Diagnosis not present

## 2018-06-21 DIAGNOSIS — J3081 Allergic rhinitis due to animal (cat) (dog) hair and dander: Secondary | ICD-10-CM | POA: Diagnosis not present

## 2018-06-29 DIAGNOSIS — J302 Other seasonal allergic rhinitis: Secondary | ICD-10-CM | POA: Diagnosis not present

## 2018-07-05 DIAGNOSIS — J302 Other seasonal allergic rhinitis: Secondary | ICD-10-CM | POA: Diagnosis not present

## 2018-07-08 DIAGNOSIS — J302 Other seasonal allergic rhinitis: Secondary | ICD-10-CM | POA: Diagnosis not present

## 2018-07-12 DIAGNOSIS — J302 Other seasonal allergic rhinitis: Secondary | ICD-10-CM | POA: Diagnosis not present

## 2018-07-15 DIAGNOSIS — J302 Other seasonal allergic rhinitis: Secondary | ICD-10-CM | POA: Diagnosis not present

## 2018-07-19 DIAGNOSIS — J302 Other seasonal allergic rhinitis: Secondary | ICD-10-CM | POA: Diagnosis not present

## 2018-07-22 DIAGNOSIS — J302 Other seasonal allergic rhinitis: Secondary | ICD-10-CM | POA: Diagnosis not present

## 2018-07-26 DIAGNOSIS — J324 Chronic pansinusitis: Secondary | ICD-10-CM | POA: Diagnosis not present

## 2018-07-26 DIAGNOSIS — J302 Other seasonal allergic rhinitis: Secondary | ICD-10-CM | POA: Diagnosis not present

## 2018-07-26 DIAGNOSIS — J479 Bronchiectasis, uncomplicated: Secondary | ICD-10-CM | POA: Diagnosis not present

## 2018-07-29 DIAGNOSIS — J302 Other seasonal allergic rhinitis: Secondary | ICD-10-CM | POA: Diagnosis not present

## 2018-08-02 DIAGNOSIS — J302 Other seasonal allergic rhinitis: Secondary | ICD-10-CM | POA: Diagnosis not present

## 2018-08-05 DIAGNOSIS — J302 Other seasonal allergic rhinitis: Secondary | ICD-10-CM | POA: Diagnosis not present

## 2018-08-13 DIAGNOSIS — Z1231 Encounter for screening mammogram for malignant neoplasm of breast: Secondary | ICD-10-CM | POA: Diagnosis not present

## 2018-08-16 DIAGNOSIS — J302 Other seasonal allergic rhinitis: Secondary | ICD-10-CM | POA: Diagnosis not present

## 2018-08-19 DIAGNOSIS — J302 Other seasonal allergic rhinitis: Secondary | ICD-10-CM | POA: Diagnosis not present

## 2018-08-23 DIAGNOSIS — J302 Other seasonal allergic rhinitis: Secondary | ICD-10-CM | POA: Diagnosis not present

## 2018-08-31 DIAGNOSIS — J302 Other seasonal allergic rhinitis: Secondary | ICD-10-CM | POA: Diagnosis not present

## 2018-09-07 DIAGNOSIS — J302 Other seasonal allergic rhinitis: Secondary | ICD-10-CM | POA: Diagnosis not present

## 2018-09-09 DIAGNOSIS — J302 Other seasonal allergic rhinitis: Secondary | ICD-10-CM | POA: Diagnosis not present

## 2018-09-14 DIAGNOSIS — J302 Other seasonal allergic rhinitis: Secondary | ICD-10-CM | POA: Diagnosis not present

## 2018-09-16 DIAGNOSIS — J302 Other seasonal allergic rhinitis: Secondary | ICD-10-CM | POA: Diagnosis not present

## 2018-09-21 DIAGNOSIS — J302 Other seasonal allergic rhinitis: Secondary | ICD-10-CM | POA: Diagnosis not present

## 2018-09-23 DIAGNOSIS — J302 Other seasonal allergic rhinitis: Secondary | ICD-10-CM | POA: Diagnosis not present

## 2018-09-28 DIAGNOSIS — J302 Other seasonal allergic rhinitis: Secondary | ICD-10-CM | POA: Diagnosis not present

## 2018-09-30 DIAGNOSIS — J302 Other seasonal allergic rhinitis: Secondary | ICD-10-CM | POA: Diagnosis not present

## 2018-10-05 DIAGNOSIS — J302 Other seasonal allergic rhinitis: Secondary | ICD-10-CM | POA: Diagnosis not present

## 2018-10-06 DIAGNOSIS — J329 Chronic sinusitis, unspecified: Secondary | ICD-10-CM | POA: Diagnosis not present

## 2018-10-06 DIAGNOSIS — J4540 Moderate persistent asthma, uncomplicated: Secondary | ICD-10-CM | POA: Diagnosis not present

## 2018-10-06 DIAGNOSIS — J3089 Other allergic rhinitis: Secondary | ICD-10-CM | POA: Diagnosis not present

## 2018-10-06 DIAGNOSIS — J3081 Allergic rhinitis due to animal (cat) (dog) hair and dander: Secondary | ICD-10-CM | POA: Diagnosis not present

## 2018-10-06 DIAGNOSIS — J301 Allergic rhinitis due to pollen: Secondary | ICD-10-CM | POA: Diagnosis not present

## 2018-10-07 DIAGNOSIS — J302 Other seasonal allergic rhinitis: Secondary | ICD-10-CM | POA: Diagnosis not present

## 2018-10-12 DIAGNOSIS — J302 Other seasonal allergic rhinitis: Secondary | ICD-10-CM | POA: Diagnosis not present

## 2018-10-19 DIAGNOSIS — J302 Other seasonal allergic rhinitis: Secondary | ICD-10-CM | POA: Diagnosis not present

## 2018-10-26 DIAGNOSIS — J302 Other seasonal allergic rhinitis: Secondary | ICD-10-CM | POA: Diagnosis not present

## 2018-11-02 DIAGNOSIS — J302 Other seasonal allergic rhinitis: Secondary | ICD-10-CM | POA: Diagnosis not present

## 2018-11-05 DIAGNOSIS — J479 Bronchiectasis, uncomplicated: Secondary | ICD-10-CM | POA: Diagnosis not present

## 2018-11-05 DIAGNOSIS — J324 Chronic pansinusitis: Secondary | ICD-10-CM | POA: Diagnosis not present

## 2018-11-09 DIAGNOSIS — J302 Other seasonal allergic rhinitis: Secondary | ICD-10-CM | POA: Diagnosis not present

## 2018-11-16 DIAGNOSIS — J302 Other seasonal allergic rhinitis: Secondary | ICD-10-CM | POA: Diagnosis not present

## 2018-11-22 DIAGNOSIS — J302 Other seasonal allergic rhinitis: Secondary | ICD-10-CM | POA: Diagnosis not present

## 2018-11-22 DIAGNOSIS — J324 Chronic pansinusitis: Secondary | ICD-10-CM | POA: Diagnosis not present

## 2018-11-22 DIAGNOSIS — R9389 Abnormal findings on diagnostic imaging of other specified body structures: Secondary | ICD-10-CM | POA: Diagnosis not present

## 2018-11-22 DIAGNOSIS — J449 Chronic obstructive pulmonary disease, unspecified: Secondary | ICD-10-CM | POA: Diagnosis not present

## 2018-11-24 DIAGNOSIS — R319 Hematuria, unspecified: Secondary | ICD-10-CM | POA: Diagnosis not present

## 2018-11-30 DIAGNOSIS — J302 Other seasonal allergic rhinitis: Secondary | ICD-10-CM | POA: Diagnosis not present

## 2018-12-14 DIAGNOSIS — J302 Other seasonal allergic rhinitis: Secondary | ICD-10-CM | POA: Diagnosis not present

## 2018-12-17 DIAGNOSIS — Z1212 Encounter for screening for malignant neoplasm of rectum: Secondary | ICD-10-CM | POA: Diagnosis not present

## 2018-12-17 DIAGNOSIS — N8111 Cystocele, midline: Secondary | ICD-10-CM | POA: Diagnosis not present

## 2018-12-17 DIAGNOSIS — Z780 Asymptomatic menopausal state: Secondary | ICD-10-CM | POA: Diagnosis not present

## 2018-12-28 DIAGNOSIS — J302 Other seasonal allergic rhinitis: Secondary | ICD-10-CM | POA: Diagnosis not present

## 2019-01-11 DIAGNOSIS — J302 Other seasonal allergic rhinitis: Secondary | ICD-10-CM | POA: Diagnosis not present

## 2019-01-18 DIAGNOSIS — J302 Other seasonal allergic rhinitis: Secondary | ICD-10-CM | POA: Diagnosis not present

## 2019-01-25 DIAGNOSIS — J302 Other seasonal allergic rhinitis: Secondary | ICD-10-CM | POA: Diagnosis not present

## 2019-01-26 DIAGNOSIS — H40013 Open angle with borderline findings, low risk, bilateral: Secondary | ICD-10-CM | POA: Diagnosis not present

## 2019-01-26 DIAGNOSIS — D485 Neoplasm of uncertain behavior of skin: Secondary | ICD-10-CM | POA: Diagnosis not present

## 2019-02-01 DIAGNOSIS — J302 Other seasonal allergic rhinitis: Secondary | ICD-10-CM | POA: Diagnosis not present

## 2019-02-22 DIAGNOSIS — Z23 Encounter for immunization: Secondary | ICD-10-CM | POA: Diagnosis not present

## 2019-02-22 DIAGNOSIS — J302 Other seasonal allergic rhinitis: Secondary | ICD-10-CM | POA: Diagnosis not present

## 2019-03-22 DIAGNOSIS — J302 Other seasonal allergic rhinitis: Secondary | ICD-10-CM | POA: Diagnosis not present

## 2019-03-23 DIAGNOSIS — J3089 Other allergic rhinitis: Secondary | ICD-10-CM | POA: Diagnosis not present

## 2019-03-23 DIAGNOSIS — J301 Allergic rhinitis due to pollen: Secondary | ICD-10-CM | POA: Diagnosis not present

## 2019-03-23 DIAGNOSIS — J3081 Allergic rhinitis due to animal (cat) (dog) hair and dander: Secondary | ICD-10-CM | POA: Diagnosis not present

## 2019-04-04 DIAGNOSIS — J301 Allergic rhinitis due to pollen: Secondary | ICD-10-CM | POA: Diagnosis not present

## 2019-04-04 DIAGNOSIS — J3081 Allergic rhinitis due to animal (cat) (dog) hair and dander: Secondary | ICD-10-CM | POA: Diagnosis not present

## 2019-04-04 DIAGNOSIS — J4540 Moderate persistent asthma, uncomplicated: Secondary | ICD-10-CM | POA: Diagnosis not present

## 2019-04-04 DIAGNOSIS — J3089 Other allergic rhinitis: Secondary | ICD-10-CM | POA: Diagnosis not present

## 2019-04-04 DIAGNOSIS — J329 Chronic sinusitis, unspecified: Secondary | ICD-10-CM | POA: Diagnosis not present

## 2019-04-26 DIAGNOSIS — J302 Other seasonal allergic rhinitis: Secondary | ICD-10-CM | POA: Diagnosis not present

## 2019-04-27 DIAGNOSIS — A048 Other specified bacterial intestinal infections: Secondary | ICD-10-CM | POA: Diagnosis not present

## 2019-04-27 DIAGNOSIS — E559 Vitamin D deficiency, unspecified: Secondary | ICD-10-CM | POA: Diagnosis not present

## 2019-04-27 DIAGNOSIS — Z79899 Other long term (current) drug therapy: Secondary | ICD-10-CM | POA: Diagnosis not present

## 2019-04-27 DIAGNOSIS — I10 Essential (primary) hypertension: Secondary | ICD-10-CM | POA: Diagnosis not present

## 2019-04-27 DIAGNOSIS — J479 Bronchiectasis, uncomplicated: Secondary | ICD-10-CM | POA: Diagnosis not present

## 2019-04-27 DIAGNOSIS — E785 Hyperlipidemia, unspecified: Secondary | ICD-10-CM | POA: Diagnosis not present

## 2019-04-27 DIAGNOSIS — Z1389 Encounter for screening for other disorder: Secondary | ICD-10-CM | POA: Diagnosis not present

## 2019-04-27 DIAGNOSIS — R0989 Other specified symptoms and signs involving the circulatory and respiratory systems: Secondary | ICD-10-CM | POA: Diagnosis not present

## 2019-04-27 DIAGNOSIS — G459 Transient cerebral ischemic attack, unspecified: Secondary | ICD-10-CM | POA: Diagnosis not present

## 2019-04-27 DIAGNOSIS — Z1211 Encounter for screening for malignant neoplasm of colon: Secondary | ICD-10-CM | POA: Diagnosis not present

## 2019-04-27 DIAGNOSIS — Z0001 Encounter for general adult medical examination with abnormal findings: Secondary | ICD-10-CM | POA: Diagnosis not present

## 2019-04-27 DIAGNOSIS — R109 Unspecified abdominal pain: Secondary | ICD-10-CM | POA: Diagnosis not present

## 2019-06-08 DIAGNOSIS — Z516 Encounter for desensitization to allergens: Secondary | ICD-10-CM | POA: Diagnosis not present

## 2019-06-08 DIAGNOSIS — I10 Essential (primary) hypertension: Secondary | ICD-10-CM | POA: Diagnosis not present

## 2019-06-22 DIAGNOSIS — Z23 Encounter for immunization: Secondary | ICD-10-CM | POA: Diagnosis not present

## 2019-07-06 DIAGNOSIS — J3089 Other allergic rhinitis: Secondary | ICD-10-CM | POA: Diagnosis not present

## 2019-07-20 DIAGNOSIS — Z23 Encounter for immunization: Secondary | ICD-10-CM | POA: Diagnosis not present

## 2019-08-03 DIAGNOSIS — J3089 Other allergic rhinitis: Secondary | ICD-10-CM | POA: Diagnosis not present

## 2019-08-30 DIAGNOSIS — Z1231 Encounter for screening mammogram for malignant neoplasm of breast: Secondary | ICD-10-CM | POA: Diagnosis not present

## 2019-08-31 DIAGNOSIS — J3089 Other allergic rhinitis: Secondary | ICD-10-CM | POA: Diagnosis not present

## 2019-09-28 DIAGNOSIS — J3089 Other allergic rhinitis: Secondary | ICD-10-CM | POA: Diagnosis not present

## 2019-09-30 ENCOUNTER — Other Ambulatory Visit: Payer: Self-pay | Admitting: Internal Medicine

## 2019-09-30 ENCOUNTER — Ambulatory Visit
Admission: RE | Admit: 2019-09-30 | Discharge: 2019-09-30 | Disposition: A | Discharge status: Home / Self Care | Payer: Medicare Other | Source: Ambulatory Visit | Attending: Internal Medicine | Admitting: Internal Medicine

## 2019-09-30 DIAGNOSIS — I10 Essential (primary) hypertension: Secondary | ICD-10-CM | POA: Diagnosis not present

## 2019-09-30 DIAGNOSIS — R1032 Left lower quadrant pain: Secondary | ICD-10-CM

## 2019-09-30 DIAGNOSIS — R1030 Lower abdominal pain, unspecified: Secondary | ICD-10-CM | POA: Diagnosis not present

## 2019-09-30 DIAGNOSIS — M1612 Unilateral primary osteoarthritis, left hip: Secondary | ICD-10-CM | POA: Diagnosis not present

## 2019-10-11 DIAGNOSIS — N3000 Acute cystitis without hematuria: Secondary | ICD-10-CM | POA: Diagnosis not present

## 2019-10-11 DIAGNOSIS — R309 Painful micturition, unspecified: Secondary | ICD-10-CM | POA: Diagnosis not present

## 2019-10-11 DIAGNOSIS — I10 Essential (primary) hypertension: Secondary | ICD-10-CM | POA: Diagnosis not present

## 2019-10-17 ENCOUNTER — Ambulatory Visit (INDEPENDENT_AMBULATORY_CARE_PROVIDER_SITE_OTHER): Payer: Medicare Other | Admitting: Orthopaedic Surgery

## 2019-10-17 ENCOUNTER — Other Ambulatory Visit: Payer: Self-pay

## 2019-10-17 VITALS — Ht 65.0 in | Wt 172.0 lb

## 2019-10-17 DIAGNOSIS — M1612 Unilateral primary osteoarthritis, left hip: Secondary | ICD-10-CM | POA: Insufficient documentation

## 2019-10-17 NOTE — Progress Notes (Signed)
Office Visit Note   Patient: Holly Cruz           Date of Birth: 1941-04-06           MRN: [id number] Visit Date: 10/17/2019              Requested by: Josetta Huddle, MD [address] [place],  [place] [address] PCP: Josetta Huddle, MD   Assessment & Plan: Visit Diagnoses:  1. Unilateral primary osteoarthritis, left hip     Plan: At this point I am recommending hip replacement surgery based on her clinical exam findings and x-ray findings.  She agrees with this.  She would like to talk to her son about it.  I had a long and thorough discussion about hip replacement surgery.  I talked about her interoperative and postoperative course.  I described the risk and benefits of surgery in detail.  I gave her a handout about this and showed her hip replacement model.  All questions and concerns were answered and addressed.  She does have our surgery scheduler's card and will call us if she would like to have this scheduled.  Follow-Up Instructions: Return if symptoms worsen or fail to improve.   Orders:  No orders of the defined types were placed in this encounter.  No orders of the defined types were placed in this encounter.     Procedures: No procedures performed   Clinical Data: No additional findings.   Subjective: Chief Complaint  Patient presents with  . Left Hip - Pain  The patient is here today as a referral from Dr. Inda Merlin to evaluate and treat left hip pain.  There are x-rays on the canopy system for me to evaluate.  She has been having worsening hip pain for about 3 years now.  It does hurt in the groin.  It has been getting significantly worse.  At this point it is definitely affecting her mobility, her quality of life and her actives daily living.  She has a hard time crossing her left leg over her right.  She has a hard time putting her shoes and socks on on the left side due to this pain.  She denies any injury.  Is been slowly getting worse.  At this  point at times it can be 10 out of 10 in terms of the pain.  She is not a diabetic.  HPI  Review of Systems She currently denies any headache, chest pain, shortness of breath, fever, chills, nausea, vomiting  Objective: Vital Signs: Ht 5\' 5"  (1.651 m)   Wt 172 lb (78 kg)   BMI 28.62 kg/m   Physical Exam She is alert and orient x3 and in no acute distress Ortho Exam Examination of her right hip is normal examination of her left hip shows severe pain on attempts of internal and external rotation.  There is severe pain in the groin and stiffness with rotation. Specialty Comments:  No specialty comments available.  Imaging: No results found. X-rays that accompany her on the [facility] system are low AP pelvis and lateral of both hips.  Her right hip appears normal.  The left hip shows severe joint space narrowing.  There are para-articular osteophytes as well as a cystic change in the femoral head.  The joint spaces significantly narrow and there are sclerotic changes.  PMFS History: Patient Active Problem List   Diagnosis Date Noted  . Unilateral primary osteoarthritis, left hip 10/17/2019   Past Medical History:  Diagnosis Date  . Abnormal mammogram 2011  . DJD (degenerative joint disease), cervical    severe muscle spasm  . GERD (gastroesophageal reflux disease)   . Heart burn   . Hx of abdominal pain    as per history of present illness gross hematuria with negative workup per Dr. Risa Grill 2010- no resurrence  through January 2001  . Hyperlipidemia   . Hypertension   . Insomnia   . Left carotid bruit 2015  . SOB (shortness of breath)    in past with Neg. cardiac workup with normal myocardial perfusion scan in 02-2005 per Dr. Tamala Julian.    Family History  Problem Relation Age of Onset  . Hypertension Mother   . Heart disease Mother   . Prostate cancer Father   . Hypertension Father     Past Surgical History:  Procedure Laterality Date  . carotid doppler     revealed a less  than 50% stenosis on the right, none on the left 07-27-2013  . FINGER SURGERY     Right 3rd  and 4th finger- trigger finger release   Social History   Occupational History  . Not on file  Tobacco Use  . Smoking status: Never Smoker  Substance and Sexual Activity  . Alcohol use: No  . Drug use: Not on file  . Sexual activity: Not on file

## 2019-11-07 DIAGNOSIS — R8279 Other abnormal findings on microbiological examination of urine: Secondary | ICD-10-CM | POA: Diagnosis not present

## 2019-11-07 DIAGNOSIS — N3020 Other chronic cystitis without hematuria: Secondary | ICD-10-CM | POA: Diagnosis not present

## 2019-11-07 DIAGNOSIS — R3915 Urgency of urination: Secondary | ICD-10-CM | POA: Diagnosis not present

## 2019-12-05 DIAGNOSIS — J3089 Other allergic rhinitis: Secondary | ICD-10-CM | POA: Diagnosis not present

## 2019-12-13 ENCOUNTER — Other Ambulatory Visit: Payer: Self-pay

## 2019-12-19 DIAGNOSIS — N8111 Cystocele, midline: Secondary | ICD-10-CM | POA: Diagnosis not present

## 2019-12-19 DIAGNOSIS — N3941 Urge incontinence: Secondary | ICD-10-CM | POA: Diagnosis not present

## 2019-12-19 DIAGNOSIS — Z1212 Encounter for screening for malignant neoplasm of rectum: Secondary | ICD-10-CM | POA: Diagnosis not present

## 2019-12-19 DIAGNOSIS — R8290 Unspecified abnormal findings in urine: Secondary | ICD-10-CM | POA: Diagnosis not present

## 2019-12-19 DIAGNOSIS — Z780 Asymptomatic menopausal state: Secondary | ICD-10-CM | POA: Diagnosis not present

## 2019-12-19 DIAGNOSIS — Z124 Encounter for screening for malignant neoplasm of cervix: Secondary | ICD-10-CM | POA: Diagnosis not present

## 2019-12-20 DIAGNOSIS — N952 Postmenopausal atrophic vaginitis: Secondary | ICD-10-CM | POA: Diagnosis not present

## 2019-12-20 DIAGNOSIS — N3020 Other chronic cystitis without hematuria: Secondary | ICD-10-CM | POA: Diagnosis not present

## 2019-12-30 DIAGNOSIS — K449 Diaphragmatic hernia without obstruction or gangrene: Secondary | ICD-10-CM | POA: Diagnosis not present

## 2019-12-30 DIAGNOSIS — K4020 Bilateral inguinal hernia, without obstruction or gangrene, not specified as recurrent: Secondary | ICD-10-CM | POA: Diagnosis not present

## 2019-12-30 DIAGNOSIS — R310 Gross hematuria: Secondary | ICD-10-CM | POA: Diagnosis not present

## 2019-12-30 DIAGNOSIS — M4319 Spondylolisthesis, multiple sites in spine: Secondary | ICD-10-CM | POA: Diagnosis not present

## 2019-12-30 DIAGNOSIS — N281 Cyst of kidney, acquired: Secondary | ICD-10-CM | POA: Diagnosis not present

## 2020-01-03 ENCOUNTER — Other Ambulatory Visit: Payer: Self-pay | Admitting: Physician Assistant

## 2020-01-03 DIAGNOSIS — N3020 Other chronic cystitis without hematuria: Secondary | ICD-10-CM | POA: Diagnosis not present

## 2020-01-03 DIAGNOSIS — N952 Postmenopausal atrophic vaginitis: Secondary | ICD-10-CM | POA: Diagnosis not present

## 2020-01-03 DIAGNOSIS — R3915 Urgency of urination: Secondary | ICD-10-CM | POA: Diagnosis not present

## 2020-01-04 DIAGNOSIS — J3089 Other allergic rhinitis: Secondary | ICD-10-CM | POA: Diagnosis not present

## 2020-01-05 ENCOUNTER — Other Ambulatory Visit: Payer: Self-pay | Admitting: Orthopaedic Surgery

## 2020-01-05 ENCOUNTER — Other Ambulatory Visit: Payer: Self-pay | Admitting: Physician Assistant

## 2020-01-05 NOTE — Patient Instructions (Addendum)
DUE TO COVID-19 ONLY ONE VISITOR IS ALLOWED TO COME WITH YOU AND STAY IN THE WAITING ROOM ONLY DURING PRE OP AND PROCEDURE.   IF YOU WILL BE ADMITTED INTO THE HOSPITAL YOU ARE ALLOWED ONE SUPPORT PERSON DURING VISITATION HOURS ONLY (10AM -8PM)   . The support person may change daily. . The support person must pass our screening, gel in and out, and wear a mask at all times, including in the patient's room. . Patients must also wear a mask when staff or their support person are in the room.   COVID SWAB TESTING MUST BE COMPLETED ON:  Monday, Aug. 16, 2021 at 10:40 AM   [address] [place], [place] [address]  (Must self quarantine after testing. Follow instructions on handout.)       Your procedure is scheduled on: Tuesday, Jan 10, 2020   Report to [facility] Main  Entrance    Report to admitting at 9:30 AM   Call this number if you have problems the morning of surgery [phone]   Do not eat food :After Midnight.   May have liquids until    day of surgery  CLEAR LIQUID DIET  Foods Allowed                                                                     Foods Excluded  Water, Black Coffee and tea, regular and decaf                             liquids that you cannot  Plain Jell-O in any flavor  (No red)                                           see through such as: Fruit ices (not with fruit pulp)                                     milk, soups, orange juice              Iced Popsicles (No red)                                    All solid food                                   Apple juices Sports drinks like Gatorade (No red) Lightly seasoned clear broth or consume(fat free) Sugar, honey syrup  Sample Menu Breakfast                                Lunch                                     Supper Cranberry  juice                    Beef broth                            Chicken broth Jell-O                                     Grape juice                            Apple juice Coffee or tea                        Jell-O                                      Popsicle                                                Coffee or tea                        Coffee or tea      Complete one Ensure drink the morning of surgery at   9:00 AM    the day of surgery.   Oral Hygiene is also important to reduce your risk of infection.                                    Remember - BRUSH YOUR TEETH THE MORNING OF SURGERY WITH YOUR REGULAR TOOTHPASTE   Do NOT smoke after Midnight   Take these medicines the morning of surgery with A SIP OF WATER: Amlodipine, Atenolol, Omeprazole,    Use Inhaler the morning of surgery and bring rescue inhaler day of surgery                               You may not have any metal on your body including hair pins, jewelry, and body piercings             Do not wear make-up, lotions, powders, perfumes/cologne, or deodorant             Do not wear nail polish.  Do not shave  48 hours prior to surgery.               Do not bring valuables to the hospital. [place].   Contacts, dentures or bridgework may not be worn into surgery.   Bring small overnight bag day of surgery.    Special Instructions: Bring a copy of your healthcare power of attorney and living will documents         the day of surgery if you haven't scanned them in before.              Please read over the following fact sheets  you were given: IF YOU HAVE QUESTIONS ABOUT YOUR PRE OP INSTRUCTIONS PLEASE CALL [phone]   [place] - Preparing for Surgery Before surgery, you can play an important role.  Because skin is not sterile, your skin needs to be as free of germs as possible.  You can reduce the number of germs on your skin by washing with CHG (chlorahexidine gluconate) soap before surgery.  CHG is an antiseptic cleaner which kills germs and bonds with the skin to continue killing germs even after washing. Please DO  NOT use if you have an allergy to CHG or antibacterial soaps.  If your skin becomes reddened/irritated stop using the CHG and inform your nurse when you arrive at Short Stay. Do not shave (including legs and underarms) for at least 48 hours prior to the first CHG shower.  You may shave your face/neck.  Please follow these instructions carefully:  1.  Shower with CHG Soap the night before surgery and the  morning of surgery.  2.  If you choose to wash your hair, wash your hair first as usual with your normal  shampoo.  3.  After you shampoo, rinse your hair and body thoroughly to remove the shampoo.                             4.  Use CHG as you would any other liquid soap.  You can apply chg directly to the skin and wash.  Gently with a scrungie or clean washcloth.  5.  Apply the CHG Soap to your body ONLY FROM THE NECK DOWN.   Do   not use on face/ open                           Wound or open sores. Avoid contact with eyes, ears mouth and   genitals (private parts).                       Wash face,  Genitals (private parts) with your normal soap.             6.  Wash thoroughly, paying special attention to the area where your    surgery  will be performed.  7.  Thoroughly rinse your body with warm water from the neck down.  8.  DO NOT shower/wash with your normal soap after using and rinsing off the CHG Soap.                9.  Pat yourself dry with a clean towel.            10.  Wear clean pajamas.            11.  Place clean sheets on your bed the night of your first shower and do not  sleep with pets. Day of Surgery : Do not apply any lotions/deodorants the morning of surgery.  Please wear clean clothes to the hospital/surgery center.  FAILURE TO FOLLOW THESE INSTRUCTIONS MAY RESULT IN THE CANCELLATION OF YOUR SURGERY  PATIENT SIGNATURE_________________________________  NURSE  SIGNATURE__________________________________  ________________________________________________________________________   Holly Cruz  An incentive spirometer is a tool that can help keep your lungs clear and active. This tool measures how well you are filling your lungs with each breath. Taking long deep breaths may help reverse or decrease the chance of developing breathing (pulmonary) problems (especially infection) following:  A  long period of time when you are unable to move or be active. BEFORE THE PROCEDURE   If the spirometer includes an indicator to show your best effort, your nurse or respiratory therapist will set it to a desired goal.  If possible, sit up straight or lean slightly forward. Try not to slouch.  Hold the incentive spirometer in an upright position. INSTRUCTIONS FOR USE  1. Sit on the edge of your bed if possible, or sit up as far as you can in bed or on a chair. 2. Hold the incentive spirometer in an upright position. 3. Breathe out normally. 4. Place the mouthpiece in your mouth and seal your lips tightly around it. 5. Breathe in slowly and as deeply as possible, raising the piston or the ball toward the top of the column. 6. Hold your breath for 3-5 seconds or for as long as possible. Allow the piston or ball to fall to the bottom of the column. 7. Remove the mouthpiece from your mouth and breathe out normally. 8. Rest for a few seconds and repeat Steps 1 through 7 at least 10 times every 1-2 hours when you are awake. Take your time and take a few normal breaths between deep breaths. 9. The spirometer may include an indicator to show your best effort. Use the indicator as a goal to work toward during each repetition. 10. After each set of 10 deep breaths, practice coughing to be sure your lungs are clear. If you have an incision (the cut made at the time of surgery), support your incision when coughing by placing a pillow or rolled up towels firmly  against it. Once you are able to get out of bed, walk around indoors and cough well. You may stop using the incentive spirometer when instructed by your caregiver.  RISKS AND COMPLICATIONS  Take your time so you do not get dizzy or light-headed.  If you are in pain, you may need to take or ask for pain medication before doing incentive spirometry. It is harder to take a deep breath if you are having pain. AFTER USE  Rest and breathe slowly and easily.  It can be helpful to keep track of a log of your progress. Your caregiver can provide you with a simple table to help with this. If you are using the spirometer at home, follow these instructions: [place] IF:   You are having difficultly using the spirometer.  You have trouble using the spirometer as often as instructed.  Your pain medication is not giving enough relief while using the spirometer.  You develop fever of 100.5 F (38.1 C) or higher. SEEK IMMEDIATE MEDICAL CARE IF:   You cough up bloody sputum that had not been present before.  You develop fever of 102 F (38.9 C) or greater.  You develop worsening pain at or near the incision site. MAKE SURE YOU:   Understand these instructions.  Will watch your condition.  Will get help right away if you are not doing well or get worse. Document Released: 09/22/2006 Document Revised: 08/04/2011 Document Reviewed: 11/23/2006 ExitCare Patient Information 2014 ExitCare, [place].   ________________________________________________________________________  WHAT IS A BLOOD TRANSFUSION? Blood Transfusion Information  A transfusion is the replacement of blood or some of its parts. Blood is made up of multiple cells which provide different functions.  Red blood cells carry oxygen and are used for blood loss replacement.  White blood cells fight against infection.  Platelets control bleeding.  Plasma helps  clot blood.  Other blood products are available for  specialized needs, such as hemophilia or other clotting disorders. BEFORE THE TRANSFUSION  Who gives blood for transfusions?   Healthy volunteers who are fully evaluated to make sure their blood is safe. This is blood bank blood. Transfusion therapy is the safest it has ever been in the practice of medicine. Before blood is taken from a donor, a complete history is taken to make sure that person has no history of diseases nor engages in risky social behavior (examples are intravenous drug use or sexual activity with multiple partners). The donor's travel history is screened to minimize risk of transmitting infections, such as malaria. The donated blood is tested for signs of infectious diseases, such as HIV and hepatitis. The blood is then tested to be sure it is compatible with you in order to minimize the chance of a transfusion reaction. If you or a relative donates blood, this is often done in anticipation of surgery and is not appropriate for emergency situations. It takes many days to process the donated blood. RISKS AND COMPLICATIONS Although transfusion therapy is very safe and saves many lives, the main dangers of transfusion include:   Getting an infectious disease.  Developing a transfusion reaction. This is an allergic reaction to something in the blood you were given. Every precaution is taken to prevent this. The decision to have a blood transfusion has been considered carefully by your caregiver before blood is given. Blood is not given unless the benefits outweigh the risks. AFTER THE TRANSFUSION  Right after receiving a blood transfusion, you will usually feel much better and more energetic. This is especially true if your red blood cells have gotten low (anemic). The transfusion raises the level of the red blood cells which carry oxygen, and this usually causes an energy increase.  The nurse administering the transfusion will monitor you carefully for complications. HOME CARE  INSTRUCTIONS  No special instructions are needed after a transfusion. You may find your energy is better. Speak with your caregiver about any limitations on activity for underlying diseases you may have. SEEK MEDICAL CARE IF:   Your condition is not improving after your transfusion.  You develop redness or irritation at the intravenous (IV) site. SEEK IMMEDIATE MEDICAL CARE IF:  Any of the following symptoms occur over the next 12 hours:  Shaking chills.  You have a temperature by mouth above 102 F (38.9 C), not controlled by medicine.  Chest, back, or muscle pain.  People around you feel you are not acting correctly or are confused.  Shortness of breath or difficulty breathing.  Dizziness and fainting.  You get a rash or develop hives.  You have a decrease in urine output.  Your urine turns a dark color or changes to pink, red, or brown. Any of the following symptoms occur over the next 10 days:  You have a temperature by mouth above 102 F (38.9 C), not controlled by medicine.  Shortness of breath.  Weakness after normal activity.  The white part of the eye turns yellow (jaundice).  You have a decrease in the amount of urine or are urinating less often.  Your urine turns a dark color or changes to pink, red, or brown. Document Released: 05/09/2000 Document Revised: 08/04/2011 Document Reviewed: 12/27/2007 [facility] Patient Information 2014 [place], [place].  _______________________________________________________________________

## 2020-01-05 NOTE — Progress Notes (Addendum)
COVID Vaccine Completed: Yes Date COVID Vaccine completed: 06/2019 COVID vaccine manufacturer:  Moderna     PCP - Dr. Josetta Huddle Cardiologist - Dr. Alma Friendly not seen since 2015  Chest x-ray - N/A EKG - 01/09/2020 in epic (pre op) Stress Test - greater than 2 years ECHO - N/A Cardiac Cath - N/A  Sleep Study - N/A CPAP - N/A   Fasting Blood Sugar - N/A Checks Blood Sugar __N/A ___ times a day  Blood Thinner Instructions: N/A Aspirin Instructions: Yes Last Dose: 01/06/2020  Anesthesia review: N/A  Patient denies shortness of breath, fever, cough and chest pain at PAT appointment   Patient verbalized understanding of instructions that were given to them at the PAT appointment. Patient was also instructed that they will need to review over the PAT instructions again at home before surgery.

## 2020-01-05 NOTE — Progress Notes (Signed)
Your procedure is scheduled on Tuesday August 17.  Report to [facility] Main Entrance "A" at 10:00 A.M., and check in at the Admitting office.  Call this number if you have problems the morning of surgery: [phone]  Call [phone] if you have any questions prior to your surgery date Monday-Friday 8am-4pm   Remember: Do not eat after midnight the night before your surgery  You may drink clear liquids until 09:00 A.M. the morning of your surgery.   Clear liquids allowed are: Water, Non-Citrus Juices (without pulp), Carbonated Beverages, Clear Tea, Black Coffee Only, and Gatorade   Please complete your PRE-SURGERY ENSURE that was provided to you by 09:00 A.M. the morning of your surgery. Please, if able, drink it in one setting. DO NOT SIP.  Take these medicines the morning of surgery with A SIP OF WATER: amLODipine (NORVASC) atenolol (TENORMIN)  ipratropium-albuterol (DUONEB) nebulizer omeprazole (PRILOSEC)  simvastatin (ZOCOR)   If needed: albuterol (VENTOLIN HFA) inhaler ---- Please bring all inhalers with you the day of surgery.  fluticasone [facility])   Follow your surgeon's instructions on when to stop Aspirin.  If no instructions were given by your surgeon then you will need to call the office to get those instructions.     As of today, STOP taking any Aspirin containing products, Aleve, Naproxen, Ibuprofen, Motrin, Advil, Goody's, BC's, all herbal medications, fish oil, and all vitamins.    The Morning of Surgery  Do not wear jewelry, make-up or nail polish.  Do not wear lotions, powders, or perfumes, or deodorant  Do not shave 48 hours prior to surgery.    Do not bring valuables to the hospital.  [facility] is not responsible for any belongings or valuables.  If you are a smoker, DO NOT Smoke 24 hours prior to surgery  If you wear a CPAP at night please bring your mask the morning of surgery   Remember that you must have someone to transport you home  after your surgery, and remain with you for 24 hours if you are discharged the same day.   Please bring cases for contacts, glasses, hearing aids, dentures or bridgework because it cannot be worn into surgery.    Leave your suitcase in the car.  After surgery it may be brought to your room.  For patients admitted to the hospital, discharge time will be determined by your treatment team.  Patients discharged the day of surgery will not be allowed to drive home.    Special instructions:   [place]- Preparing For Surgery  Before surgery, you can play an important role. Because skin is not sterile, your skin needs to be as free of germs as possible. You can reduce the number of germs on your skin by washing with CHG (chlorahexidine gluconate) Soap before surgery.  CHG is an antiseptic cleaner which kills germs and bonds with the skin to continue killing germs even after washing.    Oral Hygiene is also important to reduce your risk of infection.  Remember - BRUSH YOUR TEETH THE MORNING OF SURGERY WITH YOUR REGULAR TOOTHPASTE  Please do not use if you have an allergy to CHG or antibacterial soaps. If your skin becomes reddened/irritated stop using the CHG.  Do not shave (including legs and underarms) for at least 48 hours prior to first CHG shower. It is OK to shave your face.  Please follow these instructions carefully.   1. Shower the NIGHT BEFORE SURGERY and the MORNING OF SURGERY with  CHG Soap.   2. If you chose to wash your hair and body, wash as usual with your normal shampoo and body-wash/soap.  3. Rinse your hair and body thoroughly to remove the shampoo and soap.  4. Apply CHG directly to the skin (ONLY FROM THE NECK DOWN) and wash gently with a scrungie or a clean washcloth.   5. Do not use on open wounds or open sores. Avoid contact with your eyes, ears, mouth and genitals (private parts). Wash Face and genitals (private parts)  with your normal soap.   6. Wash thoroughly,  paying special attention to the area where your surgery will be performed.  7. Thoroughly rinse your body with warm water from the neck down.  8. DO NOT shower/wash with your normal soap after using and rinsing off the CHG Soap.  9. Pat yourself dry with a CLEAN TOWEL.  10. Wear CLEAN PAJAMAS to bed the night before surgery  11. Place CLEAN SHEETS on your bed the night of your first shower and DO NOT SLEEP WITH PETS.  12. Wear comfortable clothes the morning of surgery.     Day of Surgery:  Please shower the morning of surgery with the CHG soap Do not apply any deodorants/lotions. Please wear clean clothes to the hospital/surgery center.   Remember to brush your teeth WITH YOUR REGULAR TOOTHPASTE.   Please read over the following fact sheets that you were given.

## 2020-01-06 ENCOUNTER — Telehealth: Payer: Self-pay | Admitting: *Deleted

## 2020-01-06 ENCOUNTER — Inpatient Hospital Stay (HOSPITAL_COMMUNITY)
Admission: RE | Admit: 2020-01-06 | Discharge: 2020-01-06 | Disposition: A | Discharge status: Home / Self Care | Payer: Medicare Other | Source: Ambulatory Visit

## 2020-01-06 ENCOUNTER — Other Ambulatory Visit (HOSPITAL_COMMUNITY): Payer: Medicare Other

## 2020-01-06 NOTE — Telephone Encounter (Signed)
Ortho bundle pre-op call completed. 

## 2020-01-06 NOTE — Care Plan (Signed)
RNCM called to patient to discuss her upcoming Left Total Hip Arthroplasty with Dr. Ninfa Linden on 01/10/20. She is an Ortho bundle patient through [facility]. She is agreeable to case management. She will need a FWW. This will be ordered through [place] and delivered to her room prior to discharge. She has a son, who will be coming to stay with her until she feel she is capable of staying on her own. Anticipate HHPT will be needed after short hospital stay. Choices in her area provided. Referral made to [facility]. Will continue to follow for all questions/needs.

## 2020-01-09 ENCOUNTER — Encounter (HOSPITAL_COMMUNITY): Payer: Self-pay

## 2020-01-09 ENCOUNTER — Other Ambulatory Visit: Payer: Self-pay

## 2020-01-09 ENCOUNTER — Other Ambulatory Visit (HOSPITAL_COMMUNITY)
Admission: RE | Admit: 2020-01-09 | Discharge: 2020-01-09 | Disposition: A | Discharge status: Home / Self Care | Payer: Medicare Other | Source: Ambulatory Visit | Attending: Orthopaedic Surgery | Admitting: Orthopaedic Surgery

## 2020-01-09 ENCOUNTER — Encounter (HOSPITAL_COMMUNITY)
Admission: RE | Admit: 2020-01-09 | Discharge: 2020-01-09 | Disposition: A | Discharge status: Home / Self Care | Payer: Medicare Other | Source: Ambulatory Visit | Attending: Orthopaedic Surgery | Admitting: Orthopaedic Surgery

## 2020-01-09 DIAGNOSIS — Z01818 Encounter for other preprocedural examination: Secondary | ICD-10-CM | POA: Insufficient documentation

## 2020-01-09 DIAGNOSIS — Z20822 Contact with and (suspected) exposure to covid-19: Secondary | ICD-10-CM | POA: Diagnosis not present

## 2020-01-09 HISTORY — DX: Overactive bladder: N32.81

## 2020-01-09 HISTORY — DX: Unspecified asthma, uncomplicated: J45.909

## 2020-01-09 HISTORY — DX: Other allergy status, other than to drugs and biological substances: Z91.09

## 2020-01-09 LAB — CBC
HCT: 39.8 % (ref 36.0–46.0)
Hemoglobin: 13.8 g/dL (ref 12.0–15.0)
MCH: 32.9 pg (ref 26.0–34.0)
MCHC: 34.7 g/dL (ref 30.0–36.0)
MCV: 95 fL (ref 80.0–100.0)
Platelets: 200 10*3/uL (ref 150–400)
RBC: 4.19 MIL/uL (ref 3.87–5.11)
RDW: 11.9 % (ref 11.5–15.5)
WBC: 5.5 10*3/uL (ref 4.0–10.5)
nRBC: 0 % (ref 0.0–0.2)

## 2020-01-09 LAB — BASIC METABOLIC PANEL
Anion gap: 11 (ref 5–15)
BUN: 22 mg/dL (ref 8–23)
CO2: 29 mmol/L (ref 22–32)
Calcium: 9.9 mg/dL (ref 8.9–10.3)
Chloride: 98 mmol/L (ref 98–111)
Creatinine, Ser: 0.91 mg/dL (ref 0.44–1.00)
GFR calc Af Amer: >60 mL/min (ref >60)
GFR calc non Af Amer: >60 mL/min (ref >60)
Glucose, Bld: 131 mg/dL — ABNORMAL HIGH (ref 70–99)
Potassium: 4.7 mmol/L (ref 3.5–5.1)
Sodium: 138 mmol/L (ref 135–145)

## 2020-01-09 LAB — SARS CORONAVIRUS 2 (TAT 6-24 HRS): SARS Coronavirus 2: NEGATIVE

## 2020-01-09 LAB — SURGICAL PCR SCREEN
MRSA, PCR: NEGATIVE
Staphylococcus aureus: NEGATIVE

## 2020-01-10 ENCOUNTER — Observation Stay (HOSPITAL_COMMUNITY)
Admission: RE | Admit: 2020-01-10 | Discharge: 2020-01-11 | Disposition: A | Discharge status: Home / Self Care | Payer: Medicare Other | Attending: Orthopaedic Surgery | Admitting: Orthopaedic Surgery

## 2020-01-10 ENCOUNTER — Encounter (HOSPITAL_COMMUNITY)
Admission: RE | Disposition: A | Discharge status: Home / Self Care | Payer: Self-pay | Source: Home / Self Care | Attending: Orthopaedic Surgery

## 2020-01-10 ENCOUNTER — Ambulatory Visit (HOSPITAL_COMMUNITY): Payer: Medicare Other

## 2020-01-10 ENCOUNTER — Observation Stay (HOSPITAL_COMMUNITY): Payer: Medicare Other

## 2020-01-10 ENCOUNTER — Ambulatory Visit (HOSPITAL_COMMUNITY): Payer: Medicare Other | Admitting: Anesthesiology

## 2020-01-10 ENCOUNTER — Encounter (HOSPITAL_COMMUNITY): Payer: Self-pay | Admitting: Orthopaedic Surgery

## 2020-01-10 ENCOUNTER — Other Ambulatory Visit: Payer: Self-pay

## 2020-01-10 DIAGNOSIS — M1612 Unilateral primary osteoarthritis, left hip: Principal | ICD-10-CM | POA: Diagnosis present

## 2020-01-10 DIAGNOSIS — K219 Gastro-esophageal reflux disease without esophagitis: Secondary | ICD-10-CM | POA: Diagnosis not present

## 2020-01-10 DIAGNOSIS — Z471 Aftercare following joint replacement surgery: Secondary | ICD-10-CM | POA: Diagnosis not present

## 2020-01-10 DIAGNOSIS — Z96642 Presence of left artificial hip joint: Secondary | ICD-10-CM

## 2020-01-10 DIAGNOSIS — M25552 Pain in left hip: Secondary | ICD-10-CM | POA: Diagnosis present

## 2020-01-10 DIAGNOSIS — I10 Essential (primary) hypertension: Secondary | ICD-10-CM | POA: Insufficient documentation

## 2020-01-10 DIAGNOSIS — Z419 Encounter for procedure for purposes other than remedying health state, unspecified: Secondary | ICD-10-CM

## 2020-01-10 DIAGNOSIS — J45909 Unspecified asthma, uncomplicated: Secondary | ICD-10-CM | POA: Insufficient documentation

## 2020-01-10 HISTORY — PX: TOTAL HIP ARTHROPLASTY: SHX124

## 2020-01-10 LAB — TYPE AND SCREEN
ABO/RH(D): O NEG
Antibody Screen: NEGATIVE

## 2020-01-10 LAB — ABO/RH: ABO/RH(D): O NEG

## 2020-01-10 SURGERY — ARTHROPLASTY, HIP, TOTAL, ANTERIOR APPROACH
Anesthesia: Monitor Anesthesia Care | Site: Hip | Laterality: Left

## 2020-01-10 MED ORDER — ALBUTEROL SULFATE (2.5 MG/3ML) 0.083% IN NEBU: 3.0000 mL | INHALATION_SOLUTION | Freq: Four times a day (QID) | RESPIRATORY_TRACT | Status: DC | PRN

## 2020-01-10 MED ORDER — PHENYLEPHRINE 40 MCG/ML (10ML) SYRINGE FOR IV PUSH (FOR BLOOD PRESSURE SUPPORT)
PREFILLED_SYRINGE | INTRAVENOUS | Status: AC
  Filled 2020-01-10: qty 10

## 2020-01-10 MED ORDER — ASPIRIN 81 MG PO CHEW
81.0000 mg | CHEWABLE_TABLET | Freq: Two times a day (BID) | ORAL | Status: DC
  Administered 2020-01-10 – 2020-01-11 (×2): 81 mg via ORAL
  Filled 2020-01-10 (×2): qty 1

## 2020-01-10 MED ORDER — MORPHINE SULFATE (PF) 2 MG/ML IV SOLN: 0.5000 mg | INTRAVENOUS | Status: DC | PRN

## 2020-01-10 MED ORDER — ONDANSETRON HCL 4 MG PO TABS
4.0000 mg | ORAL_TABLET | Freq: Four times a day (QID) | ORAL | Status: DC | PRN
  Administered 2020-01-11: 4 mg via ORAL
  Filled 2020-01-10: qty 1

## 2020-01-10 MED ORDER — LIDOCAINE 2% (20 MG/ML) 5 ML SYRINGE
INTRAMUSCULAR | Status: AC
  Filled 2020-01-10: qty 5

## 2020-01-10 MED ORDER — LOSARTAN POTASSIUM 50 MG PO TABS: 100.0000 mg | ORAL_TABLET | Freq: Every day | ORAL | Status: DC

## 2020-01-10 MED ORDER — ONDANSETRON HCL 4 MG/2ML IJ SOLN
INTRAMUSCULAR | Status: AC
  Filled 2020-01-10: qty 2

## 2020-01-10 MED ORDER — EPHEDRINE 5 MG/ML INJ
INTRAVENOUS | Status: AC
  Filled 2020-01-10: qty 10

## 2020-01-10 MED ORDER — SODIUM CHLORIDE 0.9 % IV SOLN: INTRAVENOUS | Status: DC

## 2020-01-10 MED ORDER — SODIUM CHLORIDE 0.9 % IR SOLN
Status: DC | PRN
  Administered 2020-01-10: 1000 mL

## 2020-01-10 MED ORDER — FENTANYL CITRATE (PF) 100 MCG/2ML IJ SOLN
INTRAMUSCULAR | Status: DC | PRN
  Administered 2020-01-10: 50 ug via INTRAVENOUS

## 2020-01-10 MED ORDER — DEXAMETHASONE SODIUM PHOSPHATE 10 MG/ML IJ SOLN
INTRAMUSCULAR | Status: DC | PRN
  Administered 2020-01-10: 8 mg via INTRAVENOUS

## 2020-01-10 MED ORDER — ONDANSETRON HCL 4 MG/2ML IJ SOLN
INTRAMUSCULAR | Status: DC | PRN
  Administered 2020-01-10: 4 mg via INTRAVENOUS

## 2020-01-10 MED ORDER — PHENYLEPHRINE 40 MCG/ML (10ML) SYRINGE FOR IV PUSH (FOR BLOOD PRESSURE SUPPORT)
PREFILLED_SYRINGE | INTRAVENOUS | Status: DC | PRN
  Administered 2020-01-10 (×2): 80 ug via INTRAVENOUS

## 2020-01-10 MED ORDER — TRANEXAMIC ACID-NACL 1000-0.7 MG/100ML-% IV SOLN
1000.0000 mg | INTRAVENOUS | Status: DC
  Filled 2020-01-10: qty 100

## 2020-01-10 MED ORDER — MEPIVACAINE HCL (PF) 2 % IJ SOLN
INTRAMUSCULAR | Status: DC | PRN
  Administered 2020-01-10: 3.5 mL via INTRATHECAL

## 2020-01-10 MED ORDER — TRANEXAMIC ACID-NACL 1000-0.7 MG/100ML-% IV SOLN
1000.0000 mg | INTRAVENOUS | Status: AC
  Administered 2020-01-10: 1000 mg via INTRAVENOUS

## 2020-01-10 MED ORDER — FENTANYL CITRATE (PF) 250 MCG/5ML IJ SOLN
INTRAMUSCULAR | Status: AC
  Filled 2020-01-10: qty 5

## 2020-01-10 MED ORDER — CEFAZOLIN SODIUM-DEXTROSE 2-4 GM/100ML-% IV SOLN
2.0000 g | INTRAVENOUS | Status: AC
  Administered 2020-01-10: 2 g via INTRAVENOUS
  Filled 2020-01-10: qty 100

## 2020-01-10 MED ORDER — POVIDONE-IODINE 10 % EX SWAB
2.0000 "application " | Freq: Once | CUTANEOUS | Status: AC
  Administered 2020-01-10: 2 via TOPICAL

## 2020-01-10 MED ORDER — ORAL CARE MOUTH RINSE: 15.0000 mL | Freq: Once | OROMUCOSAL | Status: AC

## 2020-01-10 MED ORDER — METHOCARBAMOL 500 MG IVPB - SIMPLE MED
500.0000 mg | Freq: Four times a day (QID) | INTRAVENOUS | Status: DC | PRN
  Filled 2020-01-10: qty 50

## 2020-01-10 MED ORDER — PHENOL 1.4 % MT LIQD: 1.0000 | OROMUCOSAL | Status: DC | PRN

## 2020-01-10 MED ORDER — METHOCARBAMOL 500 MG PO TABS: 500.0000 mg | ORAL_TABLET | Freq: Four times a day (QID) | ORAL | Status: DC | PRN

## 2020-01-10 MED ORDER — OXYCODONE HCL 5 MG PO TABS: 5.0000 mg | ORAL_TABLET | Freq: Once | ORAL | Status: DC | PRN

## 2020-01-10 MED ORDER — EPHEDRINE SULFATE-NACL 50-0.9 MG/10ML-% IV SOSY
PREFILLED_SYRINGE | INTRAVENOUS | Status: DC | PRN
  Administered 2020-01-10 (×5): 10 mg via INTRAVENOUS

## 2020-01-10 MED ORDER — CHLORHEXIDINE GLUCONATE 0.12 % MT SOLN
15.0000 mL | Freq: Once | OROMUCOSAL | Status: AC
  Administered 2020-01-10: 15 mL via OROMUCOSAL

## 2020-01-10 MED ORDER — DIPHENHYDRAMINE HCL 12.5 MG/5ML PO ELIX: 12.5000 mg | ORAL_SOLUTION | ORAL | Status: DC | PRN

## 2020-01-10 MED ORDER — ATENOLOL 50 MG PO TABS
50.0000 mg | ORAL_TABLET | Freq: Every day | ORAL | Status: DC
  Administered 2020-01-11: 25 mg via ORAL
  Filled 2020-01-10: qty 1

## 2020-01-10 MED ORDER — PANTOPRAZOLE SODIUM 40 MG PO TBEC
40.0000 mg | DELAYED_RELEASE_TABLET | Freq: Every day | ORAL | Status: DC
  Administered 2020-01-10 – 2020-01-11 (×2): 40 mg via ORAL
  Filled 2020-01-10 (×2): qty 1

## 2020-01-10 MED ORDER — OXYCODONE HCL 5 MG/5ML PO SOLN: 5.0000 mg | Freq: Once | ORAL | Status: DC | PRN

## 2020-01-10 MED ORDER — PROMETHAZINE HCL 25 MG/ML IJ SOLN: 6.2500 mg | INTRAMUSCULAR | Status: DC | PRN

## 2020-01-10 MED ORDER — MENTHOL 3 MG MT LOZG: 1.0000 | LOZENGE | OROMUCOSAL | Status: DC | PRN

## 2020-01-10 MED ORDER — METOCLOPRAMIDE HCL 5 MG PO TABS: 5.0000 mg | ORAL_TABLET | Freq: Three times a day (TID) | ORAL | Status: DC | PRN

## 2020-01-10 MED ORDER — HYDROCODONE-ACETAMINOPHEN 5-325 MG PO TABS
1.0000 | ORAL_TABLET | ORAL | Status: DC | PRN
  Administered 2020-01-10 – 2020-01-11 (×4): 1 via ORAL
  Filled 2020-01-10 (×4): qty 1

## 2020-01-10 MED ORDER — DEXAMETHASONE SODIUM PHOSPHATE 10 MG/ML IJ SOLN
INTRAMUSCULAR | Status: AC
  Filled 2020-01-10: qty 1

## 2020-01-10 MED ORDER — POVIDONE-IODINE 10 % EX SWAB: 2.0000 "application " | Freq: Once | CUTANEOUS | Status: AC

## 2020-01-10 MED ORDER — POLYETHYLENE GLYCOL 3350 17 G PO PACK: 17.0000 g | PACK | Freq: Every day | ORAL | Status: DC | PRN

## 2020-01-10 MED ORDER — DOCUSATE SODIUM 100 MG PO CAPS
100.0000 mg | ORAL_CAPSULE | Freq: Two times a day (BID) | ORAL | Status: DC
  Administered 2020-01-10 – 2020-01-11 (×2): 100 mg via ORAL
  Filled 2020-01-10 (×2): qty 1

## 2020-01-10 MED ORDER — PROPOFOL 500 MG/50ML IV EMUL
INTRAVENOUS | Status: DC | PRN
  Administered 2020-01-10: 75 ug/kg/min via INTRAVENOUS

## 2020-01-10 MED ORDER — LIDOCAINE 2% (20 MG/ML) 5 ML SYRINGE
INTRAMUSCULAR | Status: DC | PRN
  Administered 2020-01-10: 40 mg via INTRAVENOUS

## 2020-01-10 MED ORDER — ACETAMINOPHEN 325 MG PO TABS: 325.0000 mg | ORAL_TABLET | Freq: Four times a day (QID) | ORAL | Status: DC | PRN

## 2020-01-10 MED ORDER — PROPOFOL 10 MG/ML IV BOLUS
INTRAVENOUS | Status: DC | PRN
  Administered 2020-01-10: 20 mg via INTRAVENOUS
  Administered 2020-01-10 (×4): 10 mg via INTRAVENOUS

## 2020-01-10 MED ORDER — CEFAZOLIN SODIUM-DEXTROSE 2-4 GM/100ML-% IV SOLN: 2.0000 g | INTRAVENOUS | Status: DC

## 2020-01-10 MED ORDER — FLUTICASONE PROPIONATE 50 MCG/ACT NA SUSP
2.0000 | Freq: Every day | NASAL | Status: DC | PRN
  Filled 2020-01-10: qty 16

## 2020-01-10 MED ORDER — LACTATED RINGERS IV SOLN: INTRAVENOUS | Status: DC

## 2020-01-10 MED ORDER — GABAPENTIN 100 MG PO CAPS
100.0000 mg | ORAL_CAPSULE | Freq: Three times a day (TID) | ORAL | Status: DC
  Administered 2020-01-10 – 2020-01-11 (×3): 100 mg via ORAL
  Filled 2020-01-10 (×3): qty 1

## 2020-01-10 MED ORDER — MULTIVITAMINS PO CAPS: 1.0000 | ORAL_CAPSULE | Freq: Every day | ORAL | Status: DC

## 2020-01-10 MED ORDER — HYDROCHLOROTHIAZIDE 12.5 MG PO CAPS: 12.5000 mg | ORAL_CAPSULE | Freq: Every day | ORAL | Status: DC

## 2020-01-10 MED ORDER — IPRATROPIUM-ALBUTEROL 0.5-2.5 (3) MG/3ML IN SOLN
3.0000 mL | Freq: Every day | RESPIRATORY_TRACT | Status: DC
  Filled 2020-01-10: qty 3

## 2020-01-10 MED ORDER — 0.9 % SODIUM CHLORIDE (POUR BTL) OPTIME
TOPICAL | Status: DC | PRN
  Administered 2020-01-10: 1000 mL

## 2020-01-10 MED ORDER — HYDROCODONE-ACETAMINOPHEN 7.5-325 MG PO TABS: 1.0000 | ORAL_TABLET | ORAL | Status: DC | PRN

## 2020-01-10 MED ORDER — AMLODIPINE BESYLATE 5 MG PO TABS: 5.0000 mg | ORAL_TABLET | Freq: Every day | ORAL | Status: DC

## 2020-01-10 MED ORDER — ACETAMINOPHEN 500 MG PO TABS
1000.0000 mg | ORAL_TABLET | Freq: Once | ORAL | Status: AC
  Administered 2020-01-10: 1000 mg via ORAL
  Filled 2020-01-10: qty 2

## 2020-01-10 MED ORDER — FENTANYL CITRATE (PF) 100 MCG/2ML IJ SOLN
INTRAMUSCULAR | Status: AC
  Filled 2020-01-10: qty 2

## 2020-01-10 MED ORDER — HYDROMORPHONE HCL 1 MG/ML IJ SOLN: 0.2500 mg | INTRAMUSCULAR | Status: DC | PRN

## 2020-01-10 MED ORDER — ADULT MULTIVITAMIN W/MINERALS CH
1.0000 | ORAL_TABLET | Freq: Every day | ORAL | Status: DC
  Administered 2020-01-11: 1 via ORAL
  Filled 2020-01-10 (×2): qty 1

## 2020-01-10 MED ORDER — LOSARTAN POTASSIUM-HCTZ 100-12.5 MG PO TABS: 1.0000 | ORAL_TABLET | Freq: Every day | ORAL | Status: DC

## 2020-01-10 MED ORDER — ONDANSETRON HCL 4 MG/2ML IJ SOLN: 4.0000 mg | Freq: Four times a day (QID) | INTRAMUSCULAR | Status: DC | PRN

## 2020-01-10 MED ORDER — SIMVASTATIN 20 MG PO TABS
20.0000 mg | ORAL_TABLET | Freq: Every day | ORAL | Status: DC
  Administered 2020-01-10 – 2020-01-11 (×2): 20 mg via ORAL
  Filled 2020-01-10 (×2): qty 1

## 2020-01-10 MED ORDER — ALUM & MAG HYDROXIDE-SIMETH 200-200-20 MG/5ML PO SUSP: 30.0000 mL | ORAL | Status: DC | PRN

## 2020-01-10 MED ORDER — PROPOFOL 1000 MG/100ML IV EMUL
INTRAVENOUS | Status: AC
  Filled 2020-01-10: qty 100

## 2020-01-10 MED ORDER — STERILE WATER FOR IRRIGATION IR SOLN
Status: DC | PRN
  Administered 2020-01-10: 2000 mL

## 2020-01-10 MED ORDER — METOCLOPRAMIDE HCL 5 MG/ML IJ SOLN: 5.0000 mg | Freq: Three times a day (TID) | INTRAMUSCULAR | Status: DC | PRN

## 2020-01-10 SURGICAL SUPPLY — 41 items
APL SKNCLS STERI-STRIP NONHPOA (GAUZE/BANDAGES/DRESSINGS)
BAG SPEC THK2 15X12 ZIP CLS (MISCELLANEOUS)
BAG ZIPLOCK 12X15 (MISCELLANEOUS) IMPLANT
BENZOIN TINCTURE PRP APPL 2/3 (GAUZE/BANDAGES/DRESSINGS) IMPLANT
BLADE SAW SGTL 18X1.27X75 (BLADE) ×2 IMPLANT
BLADE SAW SGTL 18X1.27X75MM (BLADE) ×1
CLOSURE WOUND 1/2 X4 (GAUZE/BANDAGES/DRESSINGS)
COVER PERINEAL POST (MISCELLANEOUS) ×3 IMPLANT
COVER SURGICAL LIGHT HANDLE (MISCELLANEOUS) ×3 IMPLANT
COVER WAND RF STERILE (DRAPES) ×3 IMPLANT
CUP SECTOR GRIPTON 50MM (Cup) ×2 IMPLANT
DRAPE STERI IOBAN 125X83 (DRAPES) ×3 IMPLANT
DRAPE U-SHAPE 47X51 STRL (DRAPES) ×6 IMPLANT
DRSG AQUACEL AG ADV 3.5X10 (GAUZE/BANDAGES/DRESSINGS) ×3 IMPLANT
DURAPREP 26ML APPLICATOR (WOUND CARE) ×3 IMPLANT
ELECT REM PT RETURN 15FT ADLT (MISCELLANEOUS) ×3 IMPLANT
GAUZE XEROFORM 1X8 LF (GAUZE/BANDAGES/DRESSINGS) ×3 IMPLANT
GLOVE BIO SURGEON STRL SZ7.5 (GLOVE) ×3 IMPLANT
GLOVE BIOGEL PI IND STRL 8 (GLOVE) ×2 IMPLANT
GLOVE BIOGEL PI INDICATOR 8 (GLOVE) ×4
GLOVE ECLIPSE 8.0 STRL XLNG CF (GLOVE) ×3 IMPLANT
GOWN STRL REUS W/TWL XL LVL3 (GOWN DISPOSABLE) ×6 IMPLANT
HANDPIECE INTERPULSE COAX TIP (DISPOSABLE) ×3
HEAD FEM STD 32X+1 STRL (Hips) ×2 IMPLANT
HOLDER FOLEY CATH W/STRAP (MISCELLANEOUS) ×3 IMPLANT
KIT TURNOVER KIT A (KITS) IMPLANT
LINER ACETABULAR 32X50 (Liner) ×2 IMPLANT
PACK ANTERIOR HIP CUSTOM (KITS) ×3 IMPLANT
PENCIL SMOKE EVACUATOR (MISCELLANEOUS) IMPLANT
SET HNDPC FAN SPRY TIP SCT (DISPOSABLE) ×1 IMPLANT
STAPLER VISISTAT 35W (STAPLE) ×2 IMPLANT
STEM CORAIL KA11 (Stem) ×2 IMPLANT
STRIP CLOSURE SKIN 1/2X4 (GAUZE/BANDAGES/DRESSINGS) IMPLANT
SUT ETHIBOND NAB CT1 #1 30IN (SUTURE) ×3 IMPLANT
SUT ETHILON 2 0 PS N (SUTURE) IMPLANT
SUT MNCRL AB 4-0 PS2 18 (SUTURE) ×3 IMPLANT
SUT VIC AB 0 CT1 36 (SUTURE) ×3 IMPLANT
SUT VIC AB 1 CT1 36 (SUTURE) ×3 IMPLANT
SUT VIC AB 2-0 CT1 27 (SUTURE) ×6
SUT VIC AB 2-0 CT1 TAPERPNT 27 (SUTURE) ×2 IMPLANT
TRAY FOLEY MTR SLVR 14FR STAT (SET/KITS/TRAYS/PACK) ×3 IMPLANT

## 2020-01-10 NOTE — Brief Op Note (Signed)
01/10/2020  1:14 PM  PATIENT:  Holly Cruz  79 y.o. female  PRE-OPERATIVE DIAGNOSIS:  left hip osteoarthritis  POST-OPERATIVE DIAGNOSIS:  left hip osteoarthritis  PROCEDURE:  Procedure(s): LEFT TOTAL HIP ARTHROPLASTY ANTERIOR APPROACH (Left)  SURGEON:  Surgeon(s) and Role:    Mcarthur Rossetti, MD - Primary  PHYSICIAN ASSISTANT:  Benita Stabile, PA-C  ANESTHESIA:   spinal  EBL:  200 mL   COUNTS:  YES  DICTATION: .Other Dictation: Dictation Number [id number]  PLAN OF CARE: Admit for overnight observation  PATIENT DISPOSITION:  PACU - hemodynamically stable.   Delay start of Pharmacological VTE agent (>24hrs) due to surgical blood loss or risk of bleeding: no

## 2020-01-10 NOTE — Care Plan (Signed)
Ortho Bundle Case Management Note  Patient Details  Name: Holly Cruz MRN: [id number] Date of Birth: January 15, 1941  [facility] called to patient to discuss her upcoming Left Total Hip Arthroplasty with Dr. Ninfa Linden on 01/10/20. She is an Ortho bundle patient through [facility]. She is agreeable to case management. She will need a FWW. This will be ordered through [place] and delivered to her room prior to discharge. She has a son, who will be coming to stay with her until she feel she is capable of staying on her own. Anticipate HHPT will be needed after short hospital stay. Choices in her area provided. Referral made to [facility]. Will continue to follow for all questions/needs.                 DME Arranged:  [facility] (States she has 3in1/BSC already) DME Agency:  [employer]  HH Arranged:  PT HH Agency:  [employer]  Additional Comments: Please contact me with any questions of if this plan should need to change.  Jamse Arn, RN, BSN, [employer]  [phone] 01/10/2020, 3:50 PM

## 2020-01-10 NOTE — Op Note (Signed)
NAME: Holly Cruz, RODGER MEDICAL RECORD [id number] ACCOUNT [id number] DATE OF BIRTH:[birth year] FACILITY: [facility] LOCATION: [facility] PHYSICIAN:Anavi Branscum Kerry Fort, MD  OPERATIVE REPORT  DATE OF PROCEDURE:  01/10/2020  PREOPERATIVE DIAGNOSIS:  Primary osteoarthritis and degenerative joint disease, left hip.  POSTOPERATIVE DIAGNOSIS:  Primary osteoarthritis and degenerative joint disease, left hip.  PROCEDURE:  Left total hip arthroplasty through direct anterior approach.  IMPLANTS:  DePuy Sector Gription acetabular component size 50, size 32+0 neutral polyethylene liner, size 11 Corail femoral component with standard offset, size 32+1 metal hip ball.  SURGEON:  Lind Guest.  Ninfa Linden, MD  ASSISTANT:  Erskine Emery, PA-C.  ANESTHESIA:  Spinal.  ANTIBIOTICS:  Two g IV Ancef.  ESTIMATED BLOOD LOSS:  200 mL.  COMPLICATIONS:  None.  INDICATIONS:  The patient is a 79 year old patient of mine who is well known to me.  She has debilitating arthritis involving her left hip.  This has been well documented with clinical exam and x-rays.  At this point, her left hip pain is detrimentally  affecting her mobility, her quality of life and her activities of daily living to the point, she does wish to proceed with a total hip arthroplasty and we have recommended this to her as well.  With surgery,  we have discussed the risk of acute blood  loss anemia, nerve or vessel injury, fracture, infection, dislocation, DVT and implant failure.  We talked about the goals being decreased pain, improved mobility and overall improved quality of life.  DESCRIPTION OF PROCEDURE:  After informed consent was obtained, the appropriate left hip was marked.  She was brought to the operating room and sat up on a stretcher where spinal anesthesia was then obtained.  She then was laid in the supine position on  a stretcher.  I was able to get a good assessment of her leg lengths.  Her standing films in our office  showed that she seems to be longer on her operative side, but she is not clinically, she is actually shorter.  I verified this in the office by laying  her supine.  With that being said, we noted we are going to match her x-rays and get her actually longer than what she will look.  Traction boots were placed on both her feet.  Next, she was placed supine on the Hana fracture table with the perineal  post in place and both legs in line skeletal traction devices.  I then assessed her leg lengths again and got a good picture so we could reproduce that with our intraoperative findings.  Her left operative hip was then prepped and draped with DuraPrep  and sterile drapes.  A time-out was called and she was identified as correct patient, correct left hip.  I then made an incision just inferior and posterior to the anterior iliac spine and carried this obliquely down the leg.  We dissected down to the  tensor fascia lata muscle.  Tensor fascia was then divided longitudinally to proceed with direct anterior approach to the hip.  We identified and cauterized circumflex vessels and identified the hip capsule and opened up the hip capsule in an L-type  format, finding just a mild joint effusion, but you could see significant periarticular osteophytes around the lateral femoral head and neck.  I placed Cobra retractors within the joint capsule around the medial and lateral femoral neck and made our  femoral neck cut with an oscillating saw just proximal to the lesser trochanter and completed this with an osteotome.  I then placed a corkscrew guide in the femoral head and removed the femoral head in its entirety and found a wide area devoid of  cartilage.  I then placed a bent Hohmann over the medial acetabular rim and removed remnants of the acetabular labrum and other debris.  I then began reaming in stepwise increments under direct visualization from a size 43 reamer in stepwise increments,  going up to a size 49.   With all reamers placed under direct visualization, the last reamer was also placed under direct fluoroscopy so I could obtain my depth of reaming, our inclination and anteversion.  I then placed the real DePuy Sector Gription  acetabular component size 50.  We went with the 32+0 neutral polyethylene liner based off of her preoperative offset as well.  Attention was then turned to the femur.  With the leg externally rotated to 120 degrees, extended and adducted, we were able to  place a Mueller retractor medially and a Hohmann retractor above the greater trochanter, released the lateral joint capsule and used a box-cutting osteotome to enter the femoral canal and a rongeur to lateralize just a little bit.  I then began  broaching using Corail broaching system from a size 8 going up to a size 11.  The size 11 felt tight and matched our preoperative templating.  We trialed a standard offset femoral neck and a 32+1 hip ball.  We brought the leg back over and up and with  traction and internal rotation, reducing the pelvis.  Assessing it radiographically, I was pleased with getting some length for her because she was shorter and we improved her offset as well.  She was stable radiographically and mechanically with stress  throughout the arc of motion.  I then dislocated the hip and removed the trial components.  I placed the real Corail femoral component size 11 with standard offset and the real 32+1 metal hip ball and again reduced this in the acetabulum.  We appreciated  the stability.  We then irrigated the soft tissue with normal saline solution using pulsatile lavage.  We closed the joint capsule with interrupted #1 Ethibond suture, followed by closing the tensor fascia with #1 Vicryl, 0 Vicryl was used to close the  deep tissue and 2-0 Vicryl was used to close subcutaneous tissue.  Interrupted staples were used to reapproximate the skin.  Xeroform and Aquacel dressing was applied.  She was taken off the  Hana table and taken to recovery room in stable condition.  All  final counts were correct.  There were no complications noted.  Of note, Benita Stabile, PA-C did assisted during the entire case from beginning to end and his assistance was crucial for facilitating all aspects of this case.  VN/NUANCE  D:01/10/2020 T:01/10/2020 JOB:[id number]

## 2020-01-10 NOTE — Transfer of Care (Signed)
Immediate Anesthesia Transfer of Care Note  Patient: Holly Cruz  Procedure(s) Performed: LEFT TOTAL HIP ARTHROPLASTY ANTERIOR APPROACH (Left Hip)  Patient Location: PACU  Anesthesia Type:MAC and Spinal  Level of Consciousness: awake, alert  and patient cooperative  Airway & Oxygen Therapy: Patient Spontanous Breathing and Patient connected to face mask oxygen  Post-op Assessment: Report given to RN and Post -op Vital signs reviewed and stable  Post vital signs: Reviewed and stable  Last Vitals:  Vitals Value Taken Time  BP 105/55 01/10/20 1327  Temp    Pulse 59 01/10/20 1329  Resp 15 01/10/20 1329  SpO2 99 % 01/10/20 1329  Vitals shown include unvalidated device data.  Last Pain:  Vitals:   01/10/20 1024  TempSrc: Oral  PainSc:       Patients Stated Pain Goal: 3 (75/73/22 5672)  Complications: No complications documented.

## 2020-01-10 NOTE — Anesthesia Preprocedure Evaluation (Addendum)
Anesthesia Evaluation  Patient identified by MRN, date of birth, ID band Patient awake    Reviewed: Allergy & Precautions, NPO status , Patient's Chart, lab work & pertinent test results  Airway Mallampati: I  TM Distance: >3 FB Neck ROM: Full    Dental no notable dental hx. (+) Caps, Teeth Intact,    Pulmonary asthma ,  Uses neb and inhaler daily    Pulmonary exam normal breath sounds clear to auscultation       Cardiovascular hypertension, Pt. on home beta blockers and Pt. on medications Normal cardiovascular exam Rhythm:Regular Rate:Normal     Neuro/Psych TIA   GI/Hepatic Neg liver ROS, GERD  Medicated and Controlled,  Endo/Other  negative endocrine ROS  Renal/GU negative Renal ROS Bladder dysfunction      Musculoskeletal  (+) Arthritis , Osteoarthritis,    Abdominal Normal abdominal exam  (+)   Peds  Hematology negative hematology ROS (+) hct 39.8, plt 200   Anesthesia Other Findings   Reproductive/Obstetrics negative OB ROS                            Anesthesia Physical Anesthesia Plan  ASA: II  Anesthesia Plan: Spinal and MAC   Post-op Pain Management:    Induction:   PONV Risk Score and Plan: 2 and Propofol infusion and TIVA  Airway Management Planned: Natural Airway and Nasal Cannula  Additional Equipment: None  Intra-op Plan:   Post-operative Plan:   Informed Consent: I have reviewed the patients History and Physical, chart, labs and discussed the procedure including the risks, benefits and alternatives for the proposed anesthesia with the patient or authorized representative who has indicated his/her understanding and acceptance.       Plan Discussed with: CRNA  Anesthesia Plan Comments:        Anesthesia Quick Evaluation

## 2020-01-10 NOTE — H&P (Signed)
TOTAL HIP ADMISSION H&P  Patient is admitted for left total hip arthroplasty.  Subjective:  Chief Complaint: left hip pain  HPI: Holly Cruz, 79 y.o. female, has a history of pain and functional disability in the left hip(s) due to arthritis and patient has failed non-surgical conservative treatments for greater than 12 weeks to include NSAID's and/or analgesics, corticosteriod injections, flexibility and strengthening excercises, use of assistive devices and activity modification.  Onset of symptoms was gradual starting 3 years ago with gradually worsening course since that time.The patient noted no past surgery on the left hip(s).  Patient currently rates pain in the left hip at 10 out of 10 with activity. Patient has night pain, worsening of pain with activity and weight bearing, trendelenberg gait, pain that interfers with activities of daily living and pain with passive range of motion. Patient has evidence of subchondral sclerosis, periarticular osteophytes and joint space narrowing by imaging studies. This condition presents safety issues increasing the risk of falls.  There is no current active infection.  Patient Active Problem List   Diagnosis Date Noted  . Unilateral primary osteoarthritis, left hip 10/17/2019   Past Medical History:  Diagnosis Date  . Abnormal mammogram 2011  . Asthma   . DJD (degenerative joint disease), cervical    severe muscle spasm  . Environmental allergies   . GERD (gastroesophageal reflux disease)   . Heart burn   . Hx of abdominal pain    as per history of present illness gross hematuria with negative workup per Dr. Risa Grill 2010- no resurrence  through January 2001  . Hyperlipidemia   . Hypertension   . Insomnia   . Left carotid bruit 2015  . OAB (overactive bladder)   . SOB (shortness of breath)    in past with Neg. cardiac workup with normal myocardial perfusion scan in 02-2005 per Dr. Tamala Julian. resolved  . TIA (transient ischemic attack) 2003     Past Surgical History:  Procedure Laterality Date  . carotid doppler     revealed a less than 50% stenosis on the right, none on the left 07-27-2013  . CATARACT EXTRACTION W/ INTRAOCULAR LENS  IMPLANT, BILATERAL    . COLONOSCOPY    . FINGER SURGERY     Right 3rd  and 4th finger- trigger finger release  . NASAL SINUS SURGERY      Current Facility-Administered Medications  Medication Dose Route Frequency Provider Last Rate Last Admin  . ceFAZolin (ANCEF) IVPB 2g/100 mL premix  2 g Intravenous On Call to OR Pete Pelt, PA-C      . ceFAZolin (ANCEF) IVPB 2g/100 mL premix  2 g Intravenous On Call to OR Pete Pelt, PA-C      . lactated ringers infusion   Intravenous Continuous Roderic Palau, MD 10 mL/hr at 01/10/20 1026 Continued from Pre-op at 01/10/20 1026  . tranexamic acid (CYKLOKAPRON) IVPB 1,000 mg  1,000 mg Intravenous To OR Pete Pelt, PA-C      . tranexamic acid (CYKLOKAPRON) IVPB 1,000 mg  1,000 mg Intravenous To OR Pete Pelt, PA-C       Allergies  Allergen Reactions  . Codeine     Nausea and vomiting  . Sulfa Antibiotics     rash  . Tetanus Toxoids     Rash  . Naprosyn [Naproxen] Swelling and Rash    Some form of severe reaction    Social History   Tobacco Use  . Smoking status: Never Smoker  .  Smokeless tobacco: Never Used  Substance Use Topics  . Alcohol use: No    Family History  Problem Relation Age of Onset  . Hypertension Mother   . Heart disease Mother   . Prostate cancer Father   . Hypertension Father      Review of Systems  All other systems reviewed and are negative.   Objective:  Physical Exam Vitals reviewed.  Constitutional:      Appearance: Normal appearance.  HENT:     Head: Normocephalic and atraumatic.  Eyes:     Extraocular Movements: Extraocular movements intact.     Pupils: Pupils are equal, round, and reactive to light.  Cardiovascular:     Rate and Rhythm: Normal rate and regular rhythm.   Pulmonary:     Effort: Pulmonary effort is normal.     Breath sounds: Normal breath sounds.  Abdominal:     Palpations: Abdomen is soft.  Musculoskeletal:     Cervical back: Normal range of motion.     Left hip: Tenderness and bony tenderness present. Decreased range of motion. Decreased strength.  Neurological:     Mental Status: She is alert and oriented to person, place, and time.  Psychiatric:        Behavior: Behavior normal.     Vital signs in last 24 hours: Temp:  [98.2 F (36.8 C)] 98.2 F (36.8 C) (08/17 1024) Pulse Rate:  [56] 56 (08/17 1024) Resp:  [18] 18 (08/17 1024) BP: (151)/(87) 151/87 (08/17 1024) SpO2:  [99 %] 99 % (08/17 1024) Weight:  [77.3 kg] 77.3 kg (08/17 1003)  Labs:   Estimated body mass index is 28.34 kg/m as calculated from the following:   Height as of this encounter: 5\' 5"  (1.651 m).   Weight as of this encounter: 77.3 kg.   Imaging Review Plain radiographs demonstrate severe degenerative joint disease of the left hip(s). The bone quality appears to be good for age and reported activity level.      Assessment/Plan:  End stage arthritis, left hip(s)  The patient history, physical examination, clinical judgement of the provider and imaging studies are consistent with end stage degenerative joint disease of the left hip(s) and total hip arthroplasty is deemed medically necessary. The treatment options including medical management, injection therapy, arthroscopy and arthroplasty were discussed at length. The risks and benefits of total hip arthroplasty were presented and reviewed. The risks due to aseptic loosening, infection, stiffness, dislocation/subluxation,  thromboembolic complications and other imponderables were discussed.  The patient acknowledged the explanation, agreed to proceed with the plan and consent was signed. Patient is being admitted for inpatient treatment for surgery, pain control, PT, OT, prophylactic antibiotics, VTE  prophylaxis, progressive ambulation and ADL's and discharge planning.The patient is planning to be discharged home with home health services

## 2020-01-10 NOTE — Anesthesia Procedure Notes (Signed)
Spinal  Patient location during procedure: OR End time: 01/10/2020 12:03 PM Staffing Performed: resident/CRNA  Resident/CRNA: Noralyn Pick D, CRNA Preanesthetic Checklist Completed: patient identified, IV checked, site marked, risks and benefits discussed, surgical consent, monitors and equipment checked, pre-op evaluation and timeout performed Spinal Block Patient position: right lateral decubitus Prep: Betadine Patient monitoring: heart rate, continuous pulse ox and blood pressure Approach: midline Location: L2-3 Injection technique: single-shot Needle Needle type: Pencan  Needle gauge: 24 G Needle length: 9 cm Assessment Sensory level: T6 Additional Notes Expiration date of kit checked and confirmed. Patient tolerated procedure well, without complications.

## 2020-01-10 NOTE — Plan of Care (Signed)
  Problem: Education: Goal: Knowledge of General Education information will improve Description: Including pain rating scale, medication(s)/side effects and non-pharmacologic comfort measures Outcome: Progressing   Problem: Activity: Goal: Risk for activity intolerance will decrease Outcome: Progressing   Problem: Nutrition: Goal: Adequate nutrition will be maintained Outcome: Progressing   Problem: Elimination: Goal: Will not experience complications related to bowel motility Outcome: Progressing   Problem: Pain Managment: Goal: General experience of comfort will improve Outcome: Progressing   Problem: Safety: Goal: Ability to remain free from injury will improve Outcome: Progressing   Problem: Skin Integrity: Goal: Risk for impaired skin integrity will decrease Outcome: Progressing   Problem: Education: Goal: Knowledge of the prescribed therapeutic regimen will improve Outcome: Progressing   Problem: Activity: Goal: Ability to avoid complications of mobility impairment will improve Outcome: Progressing

## 2020-01-10 NOTE — Evaluation (Signed)
Physical Therapy Evaluation Patient Details Name: Holly Cruz MRN: [id number] DOB: [birth year] Today's Date: 01/10/2020   History of Present Illness  79 yo female s/p L THA-direct anterior 01/10/20  Clinical Impression  On eval POD 0, pt required Min assist for mobility. She walked ~100 feet with a RW. Moderate pain with activity. Anticipate pt will progress well. Will plan to follow pt during hospital stay. Plan is for possible d/c home on tomorrow if continuing to do well.     Follow Up Recommendations Home health PT    Equipment Recommendations  Rolling walker with 5" wheels    Recommendations for Other Services       Precautions / Restrictions Precautions Precautions: Fall Restrictions Weight Bearing Restrictions: No      Mobility  Bed Mobility Overal bed mobility: Needs Assistance Bed Mobility: Supine to Sit     Supine to sit: Min assist;HOB elevated     General bed mobility comments: Assist for L LE. Increased time. Cues for technique.  Transfers Overall transfer level: Needs assistance   Transfers: Sit to/from Stand Sit to Stand: Min assist         General transfer comment: VCs safety, technique, hand placement. Assist to rise, steady, control descent.  Ambulation/Gait Ambulation/Gait assistance: Min assist Gait Distance (Feet): 100 Feet Assistive device: Rolling walker (2 wheeled) Gait Pattern/deviations: Step-to pattern;Step-through pattern;Decreased stride length     General Gait Details: VCs safety, sequence, RW proximity. Transitioned to step through pattern as distance increased. Unsteady at times. Pt denied lightheadedness.  Stairs            Wheelchair Mobility    Modified Rankin (Stroke Patients Only)       Balance Overall balance assessment: Needs assistance         Standing balance support: Bilateral upper extremity supported Standing balance-Leahy Scale: Poor                               Pertinent  Vitals/Pain Pain Assessment: 0-10 Pain Score: 5  Pain Location: L hip/thigh Pain Descriptors / Indicators: Discomfort;Sore Pain Intervention(s): Monitored during session;Repositioned;Ice applied    Home Living Family/patient expects to be discharged to:: Private residence Living Arrangements: Alone   Type of Home: House Home Access: Stairs to enter Entrance Stairs-Rails: None Entrance Stairs-Number of Steps: 1 through door Home Layout: One level Home Equipment: Bedside commode      Prior Function Level of Independence: Independent               Hand Dominance        Extremity/Trunk Assessment   Upper Extremity Assessment Upper Extremity Assessment: Overall WFL for tasks assessed    Lower Extremity Assessment Lower Extremity Assessment: Generalized weakness    Cervical / Trunk Assessment Cervical / Trunk Assessment: Normal  Communication   Communication: No difficulties  Cognition Arousal/Alertness: Awake/alert Behavior During Therapy: WFL for tasks assessed/performed Overall Cognitive Status: Within Functional Limits for tasks assessed                                        General Comments      Exercises     Assessment/Plan    PT Assessment Patient needs continued PT services  PT Problem List Decreased strength;Decreased mobility;Decreased range of motion;Decreased activity tolerance;Decreased balance;Decreased knowledge of use of DME;Pain  PT Treatment Interventions DME instruction;Gait training;Therapeutic activities;Therapeutic exercise;Patient/family education;Stair training;Balance training;Functional mobility training    PT Goals (Current goals can be found in the Care Plan section)  Acute Rehab PT Goals Patient Stated Goal: regain independence. less pain. PT Goal Formulation: With patient/family Time For Goal Achievement: 01/24/20 Potential to Achieve Goals: Good    Frequency 7X/week   Barriers to discharge         Co-evaluation               AM-PAC PT "6 Clicks" Mobility  Outcome Measure Help needed turning from your back to your side while in a flat bed without using bedrails?: A Little Help needed moving from lying on your back to sitting on the side of a flat bed without using bedrails?: A Little Help needed moving to and from a bed to a chair (including a wheelchair)?: A Little Help needed standing up from a chair using your arms (e.g., wheelchair or bedside chair)?: A Little Help needed to walk in hospital room?: A Little Help needed climbing 3-5 steps with a railing? : A Little 6 Click Score: 18    End of Session Equipment Utilized During Treatment: Gait belt Activity Tolerance: Patient tolerated treatment well Patient left: in chair;with call bell/phone within reach;with chair alarm set;with family/visitor present   PT Visit Diagnosis: Pain;Other abnormalities of gait and mobility (R26.89) Pain - Right/Left: Left Pain - part of body: Hip    Time: 4403-4742 PT Time Calculation (min) (ACUTE ONLY): 25 min   Charges:   PT Evaluation $PT Eval Low Complexity: 1 Low PT Treatments $Gait Training: 8-22 mins           Doreatha Massed, PT Acute Rehabilitation  Office: [phone] Pager: [phone]

## 2020-01-10 NOTE — Anesthesia Postprocedure Evaluation (Signed)
Anesthesia Post Note  Patient: Holly Cruz  Procedure(s) Performed: LEFT TOTAL HIP ARTHROPLASTY ANTERIOR APPROACH (Left Hip)     Patient location during evaluation: PACU Anesthesia Type: MAC and Spinal Level of consciousness: awake and alert Pain management: pain level controlled Vital Signs Assessment: post-procedure vital signs reviewed and stable Respiratory status: spontaneous breathing, nonlabored ventilation and respiratory function stable Cardiovascular status: blood pressure returned to baseline and stable Postop Assessment: no apparent nausea or vomiting, spinal receding, no headache and no backache Anesthetic complications: no   No complications documented.  Last Vitals:  Vitals:   01/10/20 1435 01/10/20 1558  BP: 125/64 (!) 144/71  Pulse: (!) 52 60  Resp: 18 16  Temp: (!) 36.4 C 36.4 C  SpO2: 100% 100%    Last Pain:  Vitals:   01/10/20 1558  TempSrc: Oral  PainSc:                  Pervis Hocking

## 2020-01-10 NOTE — Progress Notes (Signed)
Orthopedic Tech Progress Note Patient Details:  Holly Cruz [birth year] [id number]  Ortho Devices Ortho Device/Splint Location: applied overhead frame Ortho Device/Splint Interventions: Ordered, Application   Post Interventions Patient Tolerated: Well Instructions Provided: Care of device   Braulio Bosch 01/10/2020, 3:25 PM

## 2020-01-11 ENCOUNTER — Encounter (HOSPITAL_COMMUNITY): Payer: Self-pay | Admitting: Orthopaedic Surgery

## 2020-01-11 ENCOUNTER — Other Ambulatory Visit: Payer: Self-pay | Admitting: Orthopaedic Surgery

## 2020-01-11 DIAGNOSIS — M1612 Unilateral primary osteoarthritis, left hip: Secondary | ICD-10-CM | POA: Diagnosis not present

## 2020-01-11 DIAGNOSIS — J45909 Unspecified asthma, uncomplicated: Secondary | ICD-10-CM | POA: Diagnosis not present

## 2020-01-11 DIAGNOSIS — I10 Essential (primary) hypertension: Secondary | ICD-10-CM | POA: Diagnosis not present

## 2020-01-11 LAB — CBC
HCT: 32.5 % — ABNORMAL LOW (ref 36.0–46.0)
Hemoglobin: 11.2 g/dL — ABNORMAL LOW (ref 12.0–15.0)
MCH: 33.1 pg (ref 26.0–34.0)
MCHC: 34.5 g/dL (ref 30.0–36.0)
MCV: 96.2 fL (ref 80.0–100.0)
Platelets: 168 10*3/uL (ref 150–400)
RBC: 3.38 MIL/uL — ABNORMAL LOW (ref 3.87–5.11)
RDW: 11.9 % (ref 11.5–15.5)
WBC: 10.8 10*3/uL — ABNORMAL HIGH (ref 4.0–10.5)
nRBC: 0 % (ref 0.0–0.2)

## 2020-01-11 LAB — BASIC METABOLIC PANEL
Anion gap: 10 (ref 5–15)
BUN: 15 mg/dL (ref 8–23)
CO2: 24 mmol/L (ref 22–32)
Calcium: 8.7 mg/dL — ABNORMAL LOW (ref 8.9–10.3)
Chloride: 103 mmol/L (ref 98–111)
Creatinine, Ser: 0.68 mg/dL (ref 0.44–1.00)
GFR calc Af Amer: >60 mL/min (ref >60)
GFR calc non Af Amer: >60 mL/min (ref >60)
Glucose, Bld: 193 mg/dL — ABNORMAL HIGH (ref 70–99)
Potassium: 4 mmol/L (ref 3.5–5.1)
Sodium: 137 mmol/L (ref 135–145)

## 2020-01-11 MED ORDER — HYDROCODONE-ACETAMINOPHEN 5-325 MG PO TABS: 1.0000 | ORAL_TABLET | Freq: Four times a day (QID) | ORAL | 0 refills | Status: DC | PRN

## 2020-01-11 MED ORDER — ASPIRIN 81 MG PO CHEW: 81.0000 mg | CHEWABLE_TABLET | Freq: Two times a day (BID) | ORAL | 0 refills | Status: AC

## 2020-01-11 MED ORDER — METHOCARBAMOL 500 MG PO TABS: 500.0000 mg | ORAL_TABLET | Freq: Four times a day (QID) | ORAL | 1 refills | Status: DC | PRN

## 2020-01-11 MED ORDER — ONDANSETRON 4 MG PO TBDP
4.0000 mg | ORAL_TABLET | Freq: Three times a day (TID) | ORAL | 0 refills | Status: DC | PRN
Start: 2020-01-11 — End: 2020-09-04

## 2020-01-11 NOTE — Plan of Care (Signed)
  Problem: Education: Goal: Knowledge of General Education information will improve Description: Including pain rating scale, medication(s)/side effects and non-pharmacologic comfort measures Outcome: Progressing   Problem: Activity: Goal: Risk for activity intolerance will decrease Outcome: Progressing   Problem: Pain Managment: Goal: General experience of comfort will improve Outcome: Progressing   Problem: Pain Management: Goal: Pain level will decrease with appropriate interventions Outcome: Progressing

## 2020-01-11 NOTE — Progress Notes (Signed)
Physical Therapy Treatment Patient Details Name: Holly Cruz MRN: [id number] DOB: 1941/02/02 Today's Date: 01/11/2020    History of Present Illness 79 yo female s/p L THA-direct anterior 01/10/20    PT Comments    Progressing well with mobility. All education completed. Okay to d/c from PT standpoint.    Follow Up Recommendations  Home health PT     Equipment Recommendations  Rolling walker with 5" wheels    Recommendations for Other Services       Precautions / Restrictions Precautions Precautions: Fall Restrictions Weight Bearing Restrictions: No Other Position/Activity Restrictions: WBAT    Mobility  Bed Mobility               General bed mobility comments: sitting EOB  Transfers Overall transfer level: Needs assistance Equipment used: Rolling walker (2 wheeled) Transfers: Sit to/from Stand Sit to Stand: Min guard         General transfer comment: VCs safety, technique, hand placement.  Ambulation/Gait Ambulation/Gait assistance: Min guard Gait Distance (Feet): 175 Feet Assistive device: Rolling walker (2 wheeled) Gait Pattern/deviations: Step-through pattern;Decreased stride length     General Gait Details: Min guard for safety. Pt c/o mild lightheadedness but she tolerated session well.   Stairs             Wheelchair Mobility    Modified Rankin (Stroke Patients Only)       Balance Overall balance assessment: Needs assistance         Standing balance support: Bilateral upper extremity supported Standing balance-Leahy Scale: Fair                              Cognition Arousal/Alertness: Awake/alert Behavior During Therapy: WFL for tasks assessed/performed Overall Cognitive Status: Within Functional Limits for tasks assessed                                        Exercises     General Comments        Pertinent Vitals/Pain Pain Assessment: 0-10 Pain Score: 5  Pain Location: L  thigh Pain Descriptors / Indicators: Discomfort;Sore Pain Intervention(s): Monitored during session    Home Living                      Prior Function            PT Goals (current goals can now be found in the care plan section) Progress towards PT goals: Progressing toward goals    Frequency    7X/week      PT Plan Current plan remains appropriate    Co-evaluation              AM-PAC PT "6 Clicks" Mobility   Outcome Measure  Help needed turning from your back to your side while in a flat bed without using bedrails?: A Little Help needed moving from lying on your back to sitting on the side of a flat bed without using bedrails?: A Little Help needed moving to and from a bed to a chair (including a wheelchair)?: A Little Help needed standing up from a chair using your arms (e.g., wheelchair or bedside chair)?: A Little Help needed to walk in hospital room?: A Little Help needed climbing 3-5 steps with a railing? : A Little 6 Click Score: 18    End of Session  Equipment Utilized During Treatment: Gait belt Activity Tolerance: Patient tolerated treatment well Patient left: in chair;with call bell/phone within reach;with family/visitor present   PT Visit Diagnosis: Pain;Other abnormalities of gait and mobility (R26.89) Pain - Right/Left: Left Pain - part of body: Hip     Time: 7121-9758 PT Time Calculation (min) (ACUTE ONLY): 10 min  Charges:  $Gait Training: 8-22 mins                         Doreatha Massed, PT Acute Rehabilitation  Office: [phone] Pager: [phone]

## 2020-01-11 NOTE — Progress Notes (Signed)
Subjective: 1 Day Post-Op Procedure(s) (LRB): LEFT TOTAL HIP ARTHROPLASTY ANTERIOR APPROACH (Left) Patient reports pain as moderate.    Objective: Vital signs in last 24 hours: Temp:  [97.5 F (36.4 C)-98.3 F (36.8 C)] 97.9 F (36.6 C) (08/18 0603) Pulse Rate:  [52-63] 57 (08/18 0603) Resp:  [13-18] 16 (08/18 0603) BP: (105-151)/(34-87) 120/57 (08/18 0603) SpO2:  [94 %-100 %] 100 % (08/18 0603) Weight:  [77.3 kg] 77.3 kg (08/17 1435)  Intake/Output from previous day: 08/17 0701 - 08/18 0700 In: 2608.3 [P.O.:300; I.V.:2108.3; IV Piggyback:200] Out: 2675 [Urine:2475; Blood:200] Intake/Output this shift: No intake/output data recorded.  Recent Labs    01/09/20 0940 01/11/20 0254  HGB 13.8 11.2*   Recent Labs    01/09/20 0940 01/11/20 0254  WBC 5.5 10.8*  RBC 4.19 3.38*  HCT 39.8 32.5*  PLT 200 168   Recent Labs    01/09/20 0940 01/11/20 0254  NA 138 137  K 4.7 4.0  CL 98 103  CO2 29 24  BUN 22 15  CREATININE 0.91 0.68  GLUCOSE 131* 193*  CALCIUM 9.9 8.7*   No results for input(s): LABPT, INR in the last 72 hours.  Sensation intact distally Intact pulses distally Dorsiflexion/Plantar flexion intact Incision: dressing C/D/I   Assessment/Plan: 1 Day Post-Op Procedure(s) (LRB): LEFT TOTAL HIP ARTHROPLASTY ANTERIOR APPROACH (Left) Up with therapy Discharge home with home health    Patient's anticipated LOS is less than 2 midnights, meeting these requirements: - Younger than 61 - Lives within 1 hour of care - Has a competent adult at home to recover with post-op recover - NO history of  - Chronic pain requiring opiods  - Diabetes  - Coronary Artery Disease  - Heart failure  - Heart attack  - Stroke  - DVT/VTE  - Cardiac arrhythmia  - Respiratory Failure/COPD  - Renal failure  - Anemia  - Advanced Liver disease       Mcarthur Rossetti 01/11/2020, 8:30 AM

## 2020-01-11 NOTE — Discharge Instructions (Signed)

## 2020-01-11 NOTE — TOC Transition Note (Signed)
Transition of Care [facility]) - CM/SW Discharge Note   Patient Details  Name: Holly Cruz MRN: [id number] Date of Birth: 1940/10/16  Transition of Care [facility]) CM/SW Contact:  Lennart Pall, LCSW Phone Number: 01/11/2020, 9:54 AM   Clinical Narrative:    Met briefly with pt and confirming need for rolling walker and plan for HHPT via [facility] in [place]  No further TOC needs.   Final next level of care: [place] Barriers to Discharge: No Barriers Identified   Patient Goals and CMS Choice Patient states their goals for this hospitalization and ongoing recovery are:: go home      Discharge Placement                       Discharge Plan and Services                DME Arranged: Walker rolling (States she has 3in1/BSC already) DME Agency: [facility]       HH Arranged: PT [place] Agency: [place]        Social Determinants of Health (SDOH) Interventions     Readmission Risk Interventions No flowsheet data found.

## 2020-01-11 NOTE — Discharge Summary (Signed)
Patient ID: Holly Cruz MRN: [id number] DOB/AGE: 1940-09-25 79 y.o.  Admit date: 01/10/2020 Discharge date: 01/11/2020  Admission Diagnoses:  Principal Problem:   Unilateral primary osteoarthritis, left hip Active Problems:   Status post total replacement of left hip   Discharge Diagnoses:  Same  Past Medical History:  Diagnosis Date  . Abnormal mammogram 2011  . Asthma   . DJD (degenerative joint disease), cervical    severe muscle spasm  . Environmental allergies   . GERD (gastroesophageal reflux disease)   . Heart burn   . Hx of abdominal pain    as per history of present illness gross hematuria with negative workup per Dr. Risa Grill 2010- no resurrence  through January 2001  . Hyperlipidemia   . Hypertension   . Insomnia   . Left carotid bruit 2015  . OAB (overactive bladder)   . SOB (shortness of breath)    in past with Neg. cardiac workup with normal myocardial perfusion scan in 02-2005 per Dr. Tamala Julian. resolved  . TIA (transient ischemic attack) 2003    Surgeries: Procedure(s): LEFT TOTAL HIP ARTHROPLASTY ANTERIOR APPROACH on 01/10/2020   Consultants:   Discharged Condition: Improved  Hospital Course: Holly Cruz is an 79 y.o. female who was admitted 01/10/2020 for operative treatment ofUnilateral primary osteoarthritis, left hip. Patient has severe unremitting pain that affects sleep, daily activities, and work/hobbies. After pre-op clearance the patient was taken to the operating room on 01/10/2020 and underwent  Procedure(s): LEFT TOTAL HIP ARTHROPLASTY ANTERIOR APPROACH.    Patient was given perioperative antibiotics:  Anti-infectives (From admission, onward)   Start     Dose/Rate Route Frequency Ordered Stop   01/10/20 0945  ceFAZolin (ANCEF) IVPB 2g/100 mL premix        2 g 200 mL/hr over 30 Minutes Intravenous On call to O.R. 01/10/20 6195 01/10/20 1204   01/10/20 0945  ceFAZolin (ANCEF) IVPB 2g/100 mL premix  Status:  Discontinued        2 g 200  mL/hr over 30 Minutes Intravenous On call to O.R. 01/10/20 0932 01/10/20 1428       Patient was given sequential compression devices, early ambulation, and chemoprophylaxis to prevent DVT.  Patient benefited maximally from hospital stay and there were no complications.    Recent vital signs:  Patient Vitals for the past 24 hrs:  BP Temp Temp src Pulse Resp SpO2 Height Weight  01/11/20 0603 (!) 120/57 97.9 F (36.6 C) Oral (!) 57 16 100 % -- --  01/11/20 0235 (!) 115/56 98.3 F (36.8 C) Oral 60 -- 99 % -- --  01/10/20 2237 115/65 97.7 F (36.5 C) Oral 63 -- 99 % -- --  01/10/20 1813 127/71 97.8 F (36.6 C) Oral (!) 56 16 94 % -- --  01/10/20 1659 (!) 129/59 97.8 F (36.6 C) Oral 63 16 96 % -- --  01/10/20 1558 (!) 144/71 97.6 F (36.4 C) Oral 60 16 100 % -- --  01/10/20 1435 125/64 (!) 97.5 F (36.4 C) Oral (!) 52 18 100 % 5\' 5"  (1.651 m) 77.3 kg  01/10/20 1415 (!) 116/44 (!) 97.5 F (36.4 C) -- (!) 52 15 100 % -- --  01/10/20 1400 (!) 110/48 -- -- (!) 52 17 98 % -- --  01/10/20 1345 (!) 110/34 -- -- (!) 52 13 100 % -- --  01/10/20 1330 (!) 106/53 -- -- (!) 59 15 99 % -- --  01/10/20 1327 (!) 105/55 97.7 F (36.5 C) --  60 16 96 % -- --  01/10/20 1024 (!) 151/87 98.2 F (36.8 C) Oral (!) 56 18 99 % -- --  01/10/20 1003 -- -- -- -- -- -- 5\' 5"  (1.651 m) 77.3 kg     Recent laboratory studies:  Recent Labs    01/09/20 0940 01/09/20 0940 01/11/20 0254  WBC 5.5  --  10.8*  HGB 13.8  --  11.2*  HCT 39.8  --  32.5*  PLT 200  --  168  NA 138  --  137  K 4.7  --  4.0  CL 98  --  103  CO2 29  --  24  BUN 22  --  15  CREATININE 0.91  --  0.68  GLUCOSE 131*  --  193*  CALCIUM 9.9   < > 8.7*   < > = values in this interval not displayed.     Discharge Medications:   Allergies as of 01/11/2020      Reactions   Codeine    Nausea and vomiting   Sulfa Antibiotics    rash   Tetanus Toxoids    Rash   Naprosyn [naproxen] Swelling, Rash   Some form of severe reaction       Medication List    STOP taking these medications   VITAMIN D PO     TAKE these medications   albuterol 108 (90 Base) MCG/ACT inhaler Commonly known as: VENTOLIN HFA Inhale into the lungs every 6 (six) hours as needed for wheezing or shortness of breath.   amLODipine 5 MG tablet Commonly known as: NORVASC Take 5 mg by mouth daily.   aspirin 81 MG chewable tablet Chew 1 tablet (81 mg total) by mouth 2 (two) times daily. What changed:   how much to take  when to take this   atenolol 50 MG tablet Commonly known as: TENORMIN Take 50 mg by mouth daily.   estradiol 0.1 MG/GM vaginal cream Commonly known as: ESTRACE Place 1 Applicatorful vaginally every evening.   fluticasone 50 MCG/ACT nasal spray Commonly known as: FLONASE Place 2 sprays into both nostrils daily as needed for allergies or rhinitis.   HYDROcodone-acetaminophen 5-325 MG tablet Commonly known as: NORCO/VICODIN Take 1-2 tablets by mouth every 6 (six) hours as needed for moderate pain (pain score 4-6).   ipratropium-albuterol 0.5-2.5 (3) MG/3ML Soln Commonly known as: DUONEB Take 3 mLs by nebulization daily.   losartan-hydrochlorothiazide 100-12.5 MG tablet Commonly known as: HYZAAR Take 1 tablet by mouth daily.   Melatonin 10 MG Tabs Take 10 mg by mouth 3 (three) times a week.   methocarbamol 500 MG tablet Commonly known as: ROBAXIN Take 1 tablet (500 mg total) by mouth every 6 (six) hours as needed for muscle spasms.   multivitamin capsule Take 1 capsule by mouth daily.   Omega-3 1000 MG Caps Take 1,000-2,000 mg by mouth See admin instructions. Take 1000 mg in the morning and 2000 mg at bedtime   omeprazole 20 MG capsule Commonly known as: PRILOSEC Take 20 mg by mouth daily.   PRESCRIPTION MEDICATION Environmental injection every 30 days   simvastatin 20 MG tablet Commonly known as: ZOCOR Take 20 mg by mouth daily.   Symbicort 80-4.5 MCG/ACT inhaler Generic drug:  budesonide-formoterol Inhale 2 puffs into the lungs at bedtime.            Durable Medical Equipment  (From admission, onward)         Start     Ordered  01/10/20 1439  DME 3 n 1  Once        01/10/20 1438   01/10/20 1439  DME Walker rolling  Once       Question Answer Comment  Walker: With 5 Inch Wheels   Patient needs a walker to treat with the following condition Status post total replacement of left hip      01/10/20 1438          Diagnostic Studies: DG Pelvis Portable  Result Date: 01/10/2020 CLINICAL DATA:  Status post total replacement of left hip. EXAM: PORTABLE PELVIS 1-2 VIEWS COMPARISON:  Radiographs of the left hip performed earlier the same day 01/10/2020 FINDINGS: Single postoperative portable AP radiograph of the pelvis is submitted. Sequela of left total hip arthroplasty. The femoral and acetabular components appear well seated. No unexpected finding. Expected overlying soft tissue swelling and gas with overlying skin staples. IMPRESSION: Single AP portable postoperative radiograph of the pelvis demonstrating left total hip arthroplasty as described. Electronically Signed   By: Kellie Simmering DO   On: 01/10/2020 14:04   DG C-Arm 1-60 Min-No Report  Result Date: 01/10/2020 CLINICAL DATA:  Left total hip arthroplasty. EXAM: OPERATIVE left HIP (WITH PELVIS IF PERFORMED) 3 VIEWS TECHNIQUE: Fluoroscopic spot image(s) were submitted for interpretation post-operatively. COMPARISON:  Bilateral hip radiographs scratched at bilateral hip radiographs 09/30/2019 FINDINGS: Intraoperative images demonstrate left total hip arthroplasty. The hip appears located on this single view. No adjacent fractures are present. IMPRESSION: Left total hip arthroplasty without radiographic evidence for complication. Electronically Signed   By: San Morelle M.D.   On: 01/10/2020 13:45   DG HIP OPERATIVE UNILAT W OR W/O PELVIS LEFT  Result Date: 01/10/2020 CLINICAL DATA:  Left total hip  arthroplasty. EXAM: OPERATIVE left HIP (WITH PELVIS IF PERFORMED) 3 VIEWS TECHNIQUE: Fluoroscopic spot image(s) were submitted for interpretation post-operatively. COMPARISON:  Bilateral hip radiographs scratched at bilateral hip radiographs 09/30/2019 FINDINGS: Intraoperative images demonstrate left total hip arthroplasty. The hip appears located on this single view. No adjacent fractures are present. IMPRESSION: Left total hip arthroplasty without radiographic evidence for complication. Electronically Signed   By: San Morelle M.D.   On: 01/10/2020 13:45    Disposition: Discharge disposition: 01-Home or Self Care          Follow-up Information    Mcarthur Rossetti, MD. Go on 01/24/2020.   Specialty: Orthopedic Surgery Why: at 10:30 am for 2 week post-op in office Contact information: [place] [address] [phone]        [facility] Follow up.   Why: Someone from the home health agency will be in contact with you after discharge to arrange your first in home physical therapy appointment               Signed: Mcarthur Rossetti 01/11/2020, 8:35 AM

## 2020-01-11 NOTE — Progress Notes (Signed)
Physical Therapy Treatment Patient Details Name: Holly Cruz MRN: [id number] DOB: 1940/12/09 Today's Date: 01/11/2020    History of Present Illness 79 yo female s/p L THA-direct anterior 01/10/20    PT Comments    Progressing well. Pt c/o some lightheadedness-BP132/63 after activity. Mild-Mod pain with activity. Will plan to have a 2nd session prior to d/c home later today.   Follow Up Recommendations  Home health PT     Equipment Recommendations  Rolling walker with 5" wheels    Recommendations for Other Services       Precautions / Restrictions Precautions Precautions: Fall Restrictions Weight Bearing Restrictions: No Other Position/Activity Restrictions: WBAT    Mobility  Bed Mobility Overal bed mobility: Needs Assistance Bed Mobility: Sit to Supine       Sit to supine: Min guard;HOB elevated   General bed mobility comments: Pt used gait belt as leg lifter to assist L LE onto bed. . Cues for technique.  Transfers Overall transfer level: Needs assistance Equipment used: Rolling walker (2 wheeled) Transfers: Sit to/from Stand Sit to Stand: Min guard         General transfer comment: VCs safety, technique, hand placement.  Ambulation/Gait Ambulation/Gait assistance: Min guard Gait Distance (Feet): 200 Feet Assistive device: Rolling walker (2 wheeled) Gait Pattern/deviations: Step-through pattern;Decreased stride length         Stairs             Wheelchair Mobility    Modified Rankin (Stroke Patients Only)       Balance Overall balance assessment: Needs assistance         Standing balance support: Bilateral upper extremity supported Standing balance-Leahy Scale: Fair                              Cognition Arousal/Alertness: Awake/alert Behavior During Therapy: WFL for tasks assessed/performed Overall Cognitive Status: Within Functional Limits for tasks assessed                                         Exercises Total Joint Exercises Hip ABduction/ADduction: AROM;Left;10 reps;Standing Knee Flexion: AROM;Left;10 reps;Standing Marching in Standing: AROM;10 reps;Standing General Exercises - Lower Extremity Heel Raises: AROM;10 reps;Standing    General Comments        Pertinent Vitals/Pain Pain Assessment: 0-10 Pain Score: 5  Pain Location: L thigh Pain Descriptors / Indicators: Discomfort;Sore Pain Intervention(s): Monitored during session;Repositioned;Ice applied    Home Living                      Prior Function            PT Goals (current goals can now be found in the care plan section) Progress towards PT goals: Progressing toward goals    Frequency    7X/week      PT Plan Current plan remains appropriate    Co-evaluation              AM-PAC PT "6 Clicks" Mobility   Outcome Measure  Help needed turning from your back to your side while in a flat bed without using bedrails?: A Little Help needed moving from lying on your back to sitting on the side of a flat bed without using bedrails?: A Little Help needed moving to and from a bed to a chair (including a wheelchair)?: A Little  Help needed standing up from a chair using your arms (e.g., wheelchair or bedside chair)?: A Little Help needed to walk in hospital room?: A Little Help needed climbing 3-5 steps with a railing? : A Little 6 Click Score: 18    End of Session Equipment Utilized During Treatment: Gait belt Activity Tolerance: Patient tolerated treatment well Patient left: in bed;with call bell/phone within reach;with family/visitor present   PT Visit Diagnosis: Pain;Other abnormalities of gait and mobility (R26.89) Pain - Right/Left: Left Pain - part of body: Hip     Time: 5176-1607 PT Time Calculation (min) (ACUTE ONLY): 20 min  Charges:  $Gait Training: 8-22 mins                         Doreatha Massed, PT Acute Rehabilitation  Office: [phone] Pager:  [phone]

## 2020-01-12 ENCOUNTER — Telehealth: Payer: Self-pay | Admitting: *Deleted

## 2020-01-12 DIAGNOSIS — N3281 Overactive bladder: Secondary | ICD-10-CM | POA: Diagnosis not present

## 2020-01-12 DIAGNOSIS — E785 Hyperlipidemia, unspecified: Secondary | ICD-10-CM | POA: Diagnosis not present

## 2020-01-12 DIAGNOSIS — Z471 Aftercare following joint replacement surgery: Secondary | ICD-10-CM | POA: Diagnosis not present

## 2020-01-12 DIAGNOSIS — G4700 Insomnia, unspecified: Secondary | ICD-10-CM | POA: Diagnosis not present

## 2020-01-12 DIAGNOSIS — J45909 Unspecified asthma, uncomplicated: Secondary | ICD-10-CM | POA: Diagnosis not present

## 2020-01-12 DIAGNOSIS — Z7951 Long term (current) use of inhaled steroids: Secondary | ICD-10-CM | POA: Diagnosis not present

## 2020-01-12 DIAGNOSIS — M47812 Spondylosis without myelopathy or radiculopathy, cervical region: Secondary | ICD-10-CM | POA: Diagnosis not present

## 2020-01-12 DIAGNOSIS — I10 Essential (primary) hypertension: Secondary | ICD-10-CM | POA: Diagnosis not present

## 2020-01-12 DIAGNOSIS — Z96642 Presence of left artificial hip joint: Secondary | ICD-10-CM | POA: Diagnosis not present

## 2020-01-12 DIAGNOSIS — Z8673 Personal history of transient ischemic attack (TIA), and cerebral infarction without residual deficits: Secondary | ICD-10-CM | POA: Diagnosis not present

## 2020-01-12 DIAGNOSIS — K219 Gastro-esophageal reflux disease without esophagitis: Secondary | ICD-10-CM | POA: Diagnosis not present

## 2020-01-12 NOTE — Telephone Encounter (Signed)
Ortho bundle D/C call completed. 

## 2020-01-12 NOTE — Care Plan (Signed)
Patient discharged at POD#1 yesterday, 01/11/20. Her son left VM late yesterday afternoon requesting Rx for Zofran due to nausea/vomiting on way home after D/C yesterday. CM saw this message this morning and appears MD has already sent to pharmacy. Spoke with patient this morning and she states she is doing well. Therapy coming in next hour. Will call with any other needs/questions.

## 2020-01-17 ENCOUNTER — Telehealth: Payer: Self-pay | Admitting: *Deleted

## 2020-01-17 DIAGNOSIS — N3281 Overactive bladder: Secondary | ICD-10-CM | POA: Diagnosis not present

## 2020-01-17 DIAGNOSIS — Z96642 Presence of left artificial hip joint: Secondary | ICD-10-CM | POA: Diagnosis not present

## 2020-01-17 DIAGNOSIS — I10 Essential (primary) hypertension: Secondary | ICD-10-CM | POA: Diagnosis not present

## 2020-01-17 DIAGNOSIS — Z471 Aftercare following joint replacement surgery: Secondary | ICD-10-CM | POA: Diagnosis not present

## 2020-01-17 DIAGNOSIS — J45909 Unspecified asthma, uncomplicated: Secondary | ICD-10-CM | POA: Diagnosis not present

## 2020-01-17 DIAGNOSIS — M47812 Spondylosis without myelopathy or radiculopathy, cervical region: Secondary | ICD-10-CM | POA: Diagnosis not present

## 2020-01-17 NOTE — Telephone Encounter (Signed)
7 day ortho bundle call completed.

## 2020-01-19 DIAGNOSIS — N3281 Overactive bladder: Secondary | ICD-10-CM | POA: Diagnosis not present

## 2020-01-19 DIAGNOSIS — M47812 Spondylosis without myelopathy or radiculopathy, cervical region: Secondary | ICD-10-CM | POA: Diagnosis not present

## 2020-01-19 DIAGNOSIS — Z96642 Presence of left artificial hip joint: Secondary | ICD-10-CM | POA: Diagnosis not present

## 2020-01-19 DIAGNOSIS — Z471 Aftercare following joint replacement surgery: Secondary | ICD-10-CM | POA: Diagnosis not present

## 2020-01-19 DIAGNOSIS — I10 Essential (primary) hypertension: Secondary | ICD-10-CM | POA: Diagnosis not present

## 2020-01-19 DIAGNOSIS — J45909 Unspecified asthma, uncomplicated: Secondary | ICD-10-CM | POA: Diagnosis not present

## 2020-01-23 DIAGNOSIS — N3281 Overactive bladder: Secondary | ICD-10-CM | POA: Diagnosis not present

## 2020-01-23 DIAGNOSIS — I10 Essential (primary) hypertension: Secondary | ICD-10-CM | POA: Diagnosis not present

## 2020-01-23 DIAGNOSIS — Z96642 Presence of left artificial hip joint: Secondary | ICD-10-CM | POA: Diagnosis not present

## 2020-01-23 DIAGNOSIS — M47812 Spondylosis without myelopathy or radiculopathy, cervical region: Secondary | ICD-10-CM | POA: Diagnosis not present

## 2020-01-23 DIAGNOSIS — Z471 Aftercare following joint replacement surgery: Secondary | ICD-10-CM | POA: Diagnosis not present

## 2020-01-23 DIAGNOSIS — J45909 Unspecified asthma, uncomplicated: Secondary | ICD-10-CM | POA: Diagnosis not present

## 2020-01-24 ENCOUNTER — Telehealth: Payer: Self-pay | Admitting: *Deleted

## 2020-01-24 ENCOUNTER — Ambulatory Visit (INDEPENDENT_AMBULATORY_CARE_PROVIDER_SITE_OTHER): Payer: Medicare Other | Admitting: Orthopaedic Surgery

## 2020-01-24 ENCOUNTER — Encounter: Payer: Self-pay | Admitting: Orthopaedic Surgery

## 2020-01-24 DIAGNOSIS — Z96642 Presence of left artificial hip joint: Secondary | ICD-10-CM

## 2020-01-24 NOTE — Progress Notes (Signed)
HPI: Holly Cruz returns now 2 weeks status post left total hip arthroplasty.  She states she is doing well.  She has no pain.  She had no fevers chills shortness of breath chest pain.  She feels overall she is doing well.  She states around the home she does not use a walker or cane.  She comes in today using a walker to ambulate.  She is taking no pain medications.  Physical exam: Left hip surgical incisions well approximated staples no signs of infection.  No seroma.  Left hip good range of motion without pain.  Left calf supple nontender.  Dorsiflexion plantarflexion left ankle intact.  Impression: Status post left total hip arthroplasty 01/10/2020.  Plan: Staples removed Steri-Strips applied.  She will work on scar tissue mobilization.  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  Follow-up with [place] in 4 weeks.  Continue her 81 mg twice daily which she was on prior to surgery.  Questions were encouraged and answered.

## 2020-01-24 NOTE — Telephone Encounter (Signed)
14 day ortho bundle call completed.

## 2020-01-25 DIAGNOSIS — N3281 Overactive bladder: Secondary | ICD-10-CM | POA: Diagnosis not present

## 2020-01-25 DIAGNOSIS — Z471 Aftercare following joint replacement surgery: Secondary | ICD-10-CM | POA: Diagnosis not present

## 2020-01-25 DIAGNOSIS — J45909 Unspecified asthma, uncomplicated: Secondary | ICD-10-CM | POA: Diagnosis not present

## 2020-01-25 DIAGNOSIS — Z96642 Presence of left artificial hip joint: Secondary | ICD-10-CM | POA: Diagnosis not present

## 2020-01-25 DIAGNOSIS — I10 Essential (primary) hypertension: Secondary | ICD-10-CM | POA: Diagnosis not present

## 2020-01-25 DIAGNOSIS — M47812 Spondylosis without myelopathy or radiculopathy, cervical region: Secondary | ICD-10-CM | POA: Diagnosis not present

## 2020-01-31 ENCOUNTER — Other Ambulatory Visit: Payer: Self-pay

## 2020-01-31 ENCOUNTER — Telehealth: Payer: Self-pay

## 2020-01-31 MED ORDER — AMOXICILLIN 500 MG PO TABS
ORAL_TABLET | ORAL | 0 refills | Status: DC
Start: 2020-01-31 — End: 2022-04-15

## 2020-01-31 NOTE — Telephone Encounter (Signed)
Patient called she is having oral surgery and is having surgery next week she wants to know which antibiotic was recommended she would like a call back and get a prescription sent in call [phone]

## 2020-01-31 NOTE — Telephone Encounter (Signed)
Patient aware this was sent in for her  

## 2020-01-31 NOTE — Telephone Encounter (Signed)
Sent in to pharmacy.  

## 2020-02-01 DIAGNOSIS — J3089 Other allergic rhinitis: Secondary | ICD-10-CM | POA: Diagnosis not present

## 2020-02-10 ENCOUNTER — Telehealth: Payer: Self-pay | Admitting: *Deleted

## 2020-02-10 NOTE — Telephone Encounter (Signed)
Ortho bundle 30 day call completed. °

## 2020-02-18 DIAGNOSIS — D485 Neoplasm of uncertain behavior of skin: Secondary | ICD-10-CM | POA: Diagnosis not present

## 2020-02-18 DIAGNOSIS — H40013 Open angle with borderline findings, low risk, bilateral: Secondary | ICD-10-CM | POA: Diagnosis not present

## 2020-02-18 DIAGNOSIS — H31093 Other chorioretinal scars, bilateral: Secondary | ICD-10-CM | POA: Diagnosis not present

## 2020-02-18 DIAGNOSIS — Z961 Presence of intraocular lens: Secondary | ICD-10-CM | POA: Diagnosis not present

## 2020-02-18 DIAGNOSIS — H43811 Vitreous degeneration, right eye: Secondary | ICD-10-CM | POA: Diagnosis not present

## 2020-02-21 ENCOUNTER — Ambulatory Visit: Payer: Medicare Other | Admitting: Orthopaedic Surgery

## 2020-02-28 ENCOUNTER — Encounter: Payer: Self-pay | Admitting: Orthopaedic Surgery

## 2020-02-28 ENCOUNTER — Ambulatory Visit (INDEPENDENT_AMBULATORY_CARE_PROVIDER_SITE_OTHER): Payer: Medicare Other | Admitting: Orthopaedic Surgery

## 2020-02-28 DIAGNOSIS — Z96642 Presence of left artificial hip joint: Secondary | ICD-10-CM

## 2020-02-28 NOTE — Progress Notes (Signed)
The patient is a 79 year old female who is now 7 weeks today status post a left total hip arthroplasty.  She reports no pain and says she has good range of motion of that hip.  She is done with physical therapy and now just does a home exercise program of walking.  On exam her left operative hip moves smoothly and fluidly.  Her right hip also moves smoothly and fluidly.  Her leg lengths are equal.  She is walking without an assistive device and with no limp.  At this point she will continue to increase her activities as comfort allows with really no limitations.  All questions and concerns were answered and addressed.  We will see her back in 6 months with a standing AP pelvis and lateral of her left operative hip.  If there are any issues before then she will let us know.

## 2020-02-29 DIAGNOSIS — J3089 Other allergic rhinitis: Secondary | ICD-10-CM | POA: Diagnosis not present

## 2020-03-13 DIAGNOSIS — Z23 Encounter for immunization: Secondary | ICD-10-CM | POA: Diagnosis not present

## 2020-03-15 DIAGNOSIS — D485 Neoplasm of uncertain behavior of skin: Secondary | ICD-10-CM | POA: Diagnosis not present

## 2020-03-15 DIAGNOSIS — H40013 Open angle with borderline findings, low risk, bilateral: Secondary | ICD-10-CM | POA: Diagnosis not present

## 2020-03-26 DIAGNOSIS — J301 Allergic rhinitis due to pollen: Secondary | ICD-10-CM | POA: Diagnosis not present

## 2020-03-26 DIAGNOSIS — J3081 Allergic rhinitis due to animal (cat) (dog) hair and dander: Secondary | ICD-10-CM | POA: Diagnosis not present

## 2020-03-26 DIAGNOSIS — J3089 Other allergic rhinitis: Secondary | ICD-10-CM | POA: Diagnosis not present

## 2020-04-05 DIAGNOSIS — J329 Chronic sinusitis, unspecified: Secondary | ICD-10-CM | POA: Diagnosis not present

## 2020-04-05 DIAGNOSIS — J3089 Other allergic rhinitis: Secondary | ICD-10-CM | POA: Diagnosis not present

## 2020-04-05 DIAGNOSIS — J3081 Allergic rhinitis due to animal (cat) (dog) hair and dander: Secondary | ICD-10-CM | POA: Diagnosis not present

## 2020-04-05 DIAGNOSIS — J4540 Moderate persistent asthma, uncomplicated: Secondary | ICD-10-CM | POA: Diagnosis not present

## 2020-04-05 DIAGNOSIS — J301 Allergic rhinitis due to pollen: Secondary | ICD-10-CM | POA: Diagnosis not present

## 2020-04-10 DIAGNOSIS — K219 Gastro-esophageal reflux disease without esophagitis: Secondary | ICD-10-CM | POA: Diagnosis not present

## 2020-04-10 DIAGNOSIS — G459 Transient cerebral ischemic attack, unspecified: Secondary | ICD-10-CM | POA: Diagnosis not present

## 2020-04-10 DIAGNOSIS — E785 Hyperlipidemia, unspecified: Secondary | ICD-10-CM | POA: Diagnosis not present

## 2020-04-10 DIAGNOSIS — J45909 Unspecified asthma, uncomplicated: Secondary | ICD-10-CM | POA: Diagnosis not present

## 2020-04-10 DIAGNOSIS — J479 Bronchiectasis, uncomplicated: Secondary | ICD-10-CM | POA: Diagnosis not present

## 2020-04-10 DIAGNOSIS — I10 Essential (primary) hypertension: Secondary | ICD-10-CM | POA: Diagnosis not present

## 2020-04-12 DIAGNOSIS — Z23 Encounter for immunization: Secondary | ICD-10-CM | POA: Diagnosis not present

## 2020-04-13 ENCOUNTER — Telehealth: Payer: Self-pay | Admitting: *Deleted

## 2020-04-13 NOTE — Telephone Encounter (Signed)
Attempted 90 day Ortho bundle call. Left VM on home and cell numbers.

## 2020-04-13 NOTE — Telephone Encounter (Signed)
90 day Ortho bundle call completed. 

## 2020-05-01 DIAGNOSIS — I10 Essential (primary) hypertension: Secondary | ICD-10-CM | POA: Diagnosis not present

## 2020-05-01 DIAGNOSIS — G459 Transient cerebral ischemic attack, unspecified: Secondary | ICD-10-CM | POA: Diagnosis not present

## 2020-05-01 DIAGNOSIS — E785 Hyperlipidemia, unspecified: Secondary | ICD-10-CM | POA: Diagnosis not present

## 2020-05-01 DIAGNOSIS — J479 Bronchiectasis, uncomplicated: Secondary | ICD-10-CM | POA: Diagnosis not present

## 2020-05-01 DIAGNOSIS — K219 Gastro-esophageal reflux disease without esophagitis: Secondary | ICD-10-CM | POA: Diagnosis not present

## 2020-05-01 DIAGNOSIS — J45909 Unspecified asthma, uncomplicated: Secondary | ICD-10-CM | POA: Diagnosis not present

## 2020-05-02 DIAGNOSIS — J3089 Other allergic rhinitis: Secondary | ICD-10-CM | POA: Diagnosis not present

## 2020-05-10 DIAGNOSIS — Z0000 Encounter for general adult medical examination without abnormal findings: Secondary | ICD-10-CM | POA: Diagnosis not present

## 2020-05-10 DIAGNOSIS — G459 Transient cerebral ischemic attack, unspecified: Secondary | ICD-10-CM | POA: Diagnosis not present

## 2020-05-10 DIAGNOSIS — I10 Essential (primary) hypertension: Secondary | ICD-10-CM | POA: Diagnosis not present

## 2020-05-10 DIAGNOSIS — Z79899 Other long term (current) drug therapy: Secondary | ICD-10-CM | POA: Diagnosis not present

## 2020-05-10 DIAGNOSIS — N3281 Overactive bladder: Secondary | ICD-10-CM | POA: Diagnosis not present

## 2020-05-10 DIAGNOSIS — K219 Gastro-esophageal reflux disease without esophagitis: Secondary | ICD-10-CM | POA: Diagnosis not present

## 2020-05-10 DIAGNOSIS — J45909 Unspecified asthma, uncomplicated: Secondary | ICD-10-CM | POA: Diagnosis not present

## 2020-05-10 DIAGNOSIS — E559 Vitamin D deficiency, unspecified: Secondary | ICD-10-CM | POA: Diagnosis not present

## 2020-05-17 DIAGNOSIS — D485 Neoplasm of uncertain behavior of skin: Secondary | ICD-10-CM | POA: Diagnosis not present

## 2020-05-17 DIAGNOSIS — H401132 Primary open-angle glaucoma, bilateral, moderate stage: Secondary | ICD-10-CM | POA: Diagnosis not present

## 2020-05-17 DIAGNOSIS — Z961 Presence of intraocular lens: Secondary | ICD-10-CM | POA: Diagnosis not present

## 2020-08-08 ENCOUNTER — Telehealth: Payer: Self-pay

## 2020-08-08 NOTE — Telephone Encounter (Signed)
Butch Penny with [facility] would like a letter/note faxed to [phone] stating if pre-medication is required before dental procedure?  CB# [phone].  Please advise.  Thank you.

## 2020-08-08 NOTE — Telephone Encounter (Signed)
Faxed to provided number  

## 2020-08-09 ENCOUNTER — Telehealth: Payer: Self-pay

## 2020-08-09 NOTE — Telephone Encounter (Signed)
Holly Cruz with [facility] called again stating that she did not receive the letter/note concerning pre-medication before dental procedures.  Please refax to [phone].  Thank you.

## 2020-08-09 NOTE — Telephone Encounter (Signed)
Can you fax this? She does NOT need pre-meds before dental appts

## 2020-08-09 NOTE — Telephone Encounter (Signed)
Letter completed in chart and faxed to # above

## 2020-08-28 ENCOUNTER — Ambulatory Visit: Payer: Medicare Other | Admitting: Orthopaedic Surgery

## 2020-09-04 ENCOUNTER — Ambulatory Visit (INDEPENDENT_AMBULATORY_CARE_PROVIDER_SITE_OTHER): Payer: Medicare Other

## 2020-09-04 ENCOUNTER — Ambulatory Visit (INDEPENDENT_AMBULATORY_CARE_PROVIDER_SITE_OTHER): Payer: Medicare Other | Admitting: Orthopaedic Surgery

## 2020-09-04 ENCOUNTER — Encounter: Payer: Self-pay | Admitting: Orthopaedic Surgery

## 2020-09-04 DIAGNOSIS — M722 Plantar fascial fibromatosis: Secondary | ICD-10-CM | POA: Diagnosis not present

## 2020-09-04 DIAGNOSIS — Z96642 Presence of left artificial hip joint: Secondary | ICD-10-CM

## 2020-09-04 NOTE — Progress Notes (Signed)
Office Visit Note   Patient: Holly Cruz           Date of Birth: [birth year]           MRN: [id number] Visit Date: 09/04/2020              Requested by: Holly Huddle, MD [address] [place],  [place] [address] PCP: Holly Huddle, MD   Assessment & Plan: Visit Diagnoses:  1. History of left hip replacement   2. Plantar fasciitis, bilateral     Plan: In regards to her left hip she can follow-up with [place] as needed.  She has any questions or concerns she will follow-up.  She did ask about bilateral foot burning sensation that is periodic.  She has had no known injury.  On exam she has tenderness over the medial tubercle of the calcaneus bilaterally and tenderness over the left Achilles insertion laterally.  Otherwise foot exams no weakness of the peroneal tendons or posterior tibial tendons bilaterally. Discussed with her stretching techniques, shoewear, and the use of Voltaren gel to the feet.  Questions were encouraged and answered at length today.  Follow-Up Instructions: Return if symptoms worsen or fail to improve.   Orders:  Orders Placed This Encounter  Procedures  . XR HIP UNILAT W OR W/O PELVIS 1V LEFT   No orders of the defined types were placed in this encounter.     Procedures: No procedures performed   Clinical Data: No additional findings.   Subjective: Chief Complaint  Patient presents with  . Left Hip - Follow-up    HPI Holly Cruz returns today 8 months status post left total hip arthroplasty.  She states she is overall doing great.  She has no complaints in regards to her hip.  She is back to walking for exercise.  She denies any numbness tingling down either leg.  Denies any groin pain. Review of Systems See HPI  Objective: Vital Signs: There were no vitals taken for this visit.  Physical Exam Constitutional:      Appearance: She is not ill-appearing or diaphoretic.  Pulmonary:     Effort: Pulmonary effort is normal.   Psychiatric:        Mood and Affect: Mood normal.     Ortho Exam Left hip excellent range of motion without pain.  Ambulates with a nonantalgic gait without any assistive device. Specialty Comments:  No specialty comments available.  Imaging: XR HIP UNILAT W OR W/O PELVIS 1V LEFT  Result Date: 09/04/2020 Left lateral hip and AP pelvis: Bilateral hips well located.  Left hip arthroplasty components appear well-seated.  No acute fractures.  No bony abnormalities.  Right hip overall well-maintained.    PMFS History: Patient Active Problem List   Diagnosis Date Noted  . Status post total replacement of left hip 01/10/2020  . Unilateral primary osteoarthritis, left hip 10/17/2019   Past Medical History:  Diagnosis Date  . Abnormal mammogram 2011  . Asthma   . DJD (degenerative joint disease), cervical    severe muscle spasm  . Environmental allergies   . GERD (gastroesophageal reflux disease)   . Heart burn   . Hx of abdominal pain    as per history of present illness gross hematuria with negative workup per Dr. Risa Cruz 2010- no resurrence  through January 2001  . Hyperlipidemia   . Hypertension   . Insomnia   . Left carotid bruit 2015  . OAB (overactive bladder)   .  SOB (shortness of breath)    in past with Neg. cardiac workup with normal myocardial perfusion scan in 02-2005 per Dr. Tamala Julian. resolved  . TIA (transient ischemic attack) 2003    Family History  Problem Relation Age of Onset  . Hypertension Mother   . Heart disease Mother   . Prostate cancer Father   . Hypertension Father     Past Surgical History:  Procedure Laterality Date  . carotid doppler     revealed a less than 50% stenosis on the right, none on the left 07-27-2013  . CATARACT EXTRACTION W/ INTRAOCULAR LENS  IMPLANT, BILATERAL    . COLONOSCOPY    . FINGER SURGERY     Right 3rd  and 4th finger- trigger finger release  . NASAL SINUS SURGERY    . TOTAL HIP ARTHROPLASTY Left 01/10/2020   Procedure:  LEFT TOTAL HIP ARTHROPLASTY ANTERIOR APPROACH;  Surgeon: Holly Rossetti, MD;  Location: [facility];  Service: Orthopedics;  Laterality: Left;   Social History   Occupational History  . Not on file  Tobacco Use  . Smoking status: Never Smoker  . Smokeless tobacco: Never Used  Vaping Use  . Vaping Use: Never used  Substance and Sexual Activity  . Alcohol use: No  . Drug use: Never  . Sexual activity: Not on file

## 2021-03-08 ENCOUNTER — Telehealth: Payer: Self-pay | Admitting: *Deleted

## 2021-03-08 NOTE — Telephone Encounter (Signed)
Ortho bundle 1 year call completed. ?

## 2021-08-06 IMAGING — DX DG PORTABLE PELVIS
1 series · 1 of 1 positions shown · non-contrast
Comparison: Radiographs of the left hip performed earlier the same
day 01/10/2020

CLINICAL DATA: Status post total replacement of left hip.

EXAM:
PORTABLE PELVIS 1-2 VIEWS

[pelvis ap]
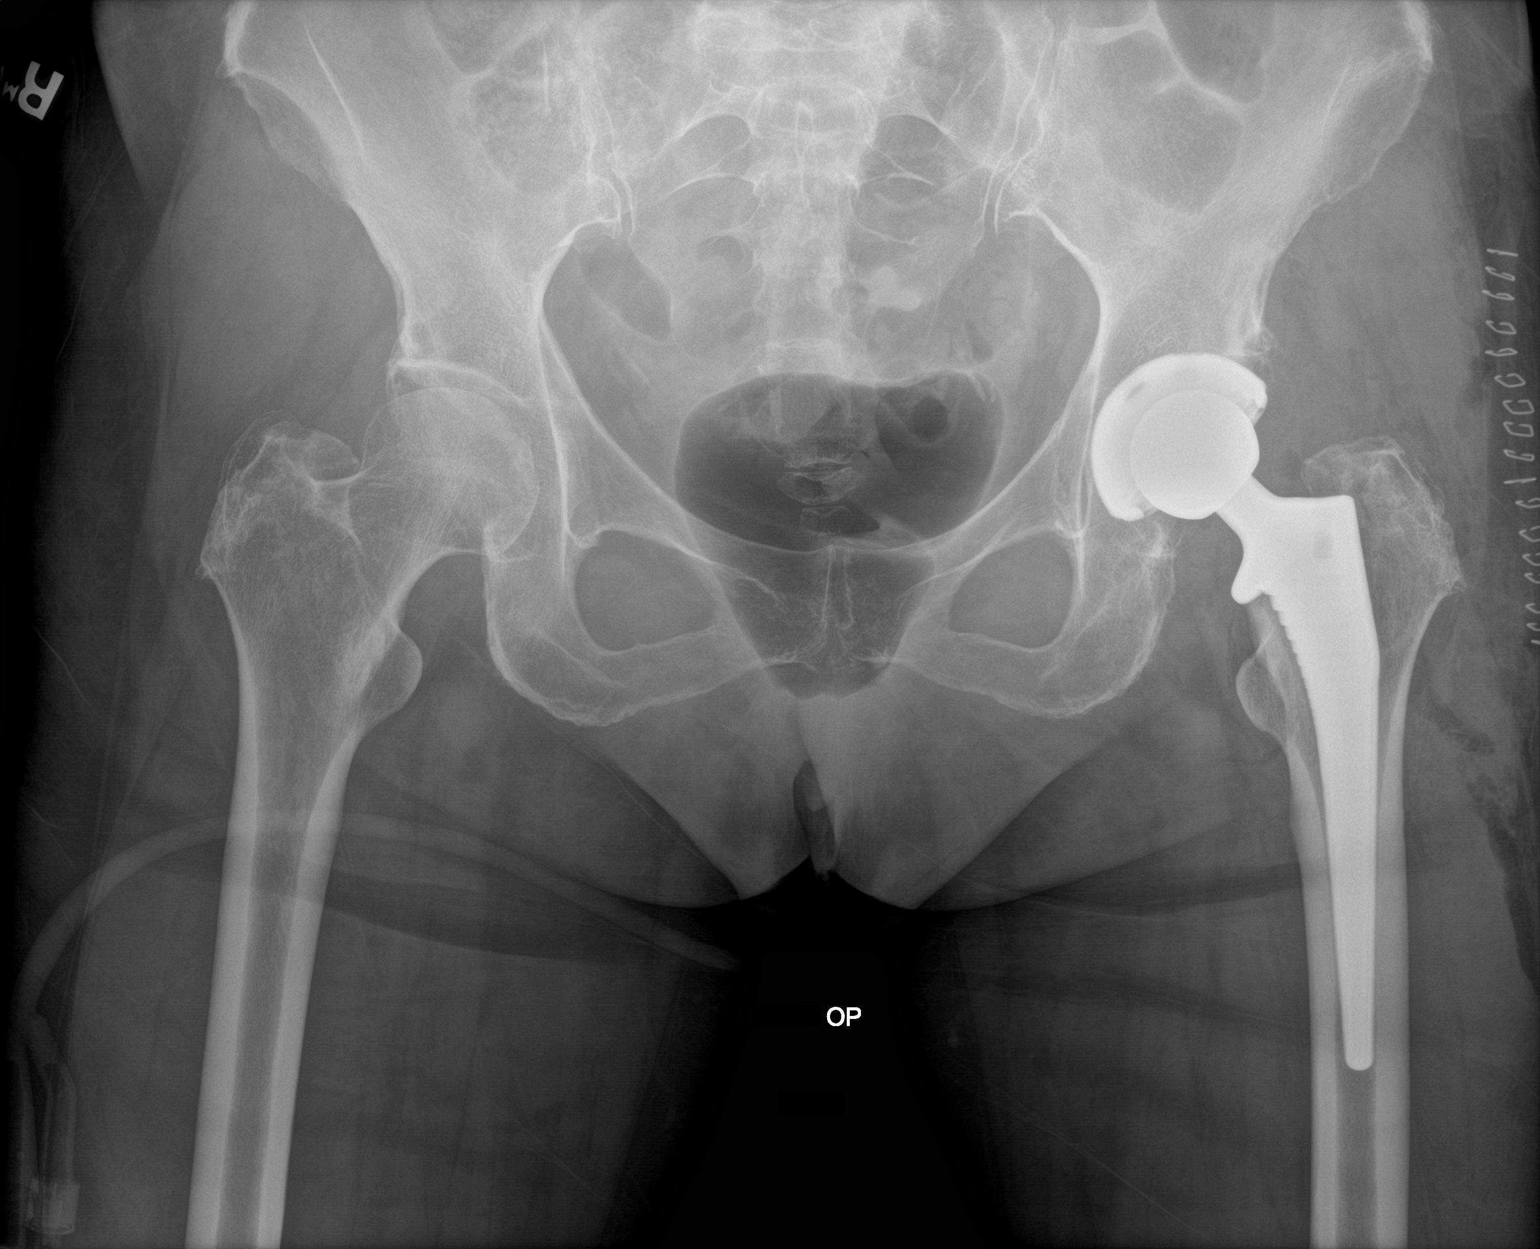

[1 of 1 positions shown; findings below may reference images not displayed]

FINDINGS: Single postoperative portable AP radiograph of the pelvis is
submitted. Sequela of left total hip arthroplasty. The femoral and
acetabular components appear well seated. No unexpected finding.
Expected overlying soft tissue swelling and gas with overlying skin
staples.
IMPRESSION: Single AP portable postoperative radiograph of the pelvis
demonstrating left total hip arthroplasty as described.

## 2021-08-06 IMAGING — RF DG C-ARM 1-60 MIN-NO REPORT
1 series · 3 of 3 positions shown · non-contrast
Comparison: Bilateral hip radiographs scratched at bilateral hip
radiographs 09/30/2019

CLINICAL DATA: Left total hip arthroplasty.

EXAM:
OPERATIVE left HIP (WITH PELVIS IF PERFORMED) 3 VIEWS
TECHNIQUE: Fluoroscopic spot image(s) were submitted for interpretation
post-operatively.

[Series 1: unknown protocol · 0.20mm/px · 3 of 3 slices shown]
[im 1/3]
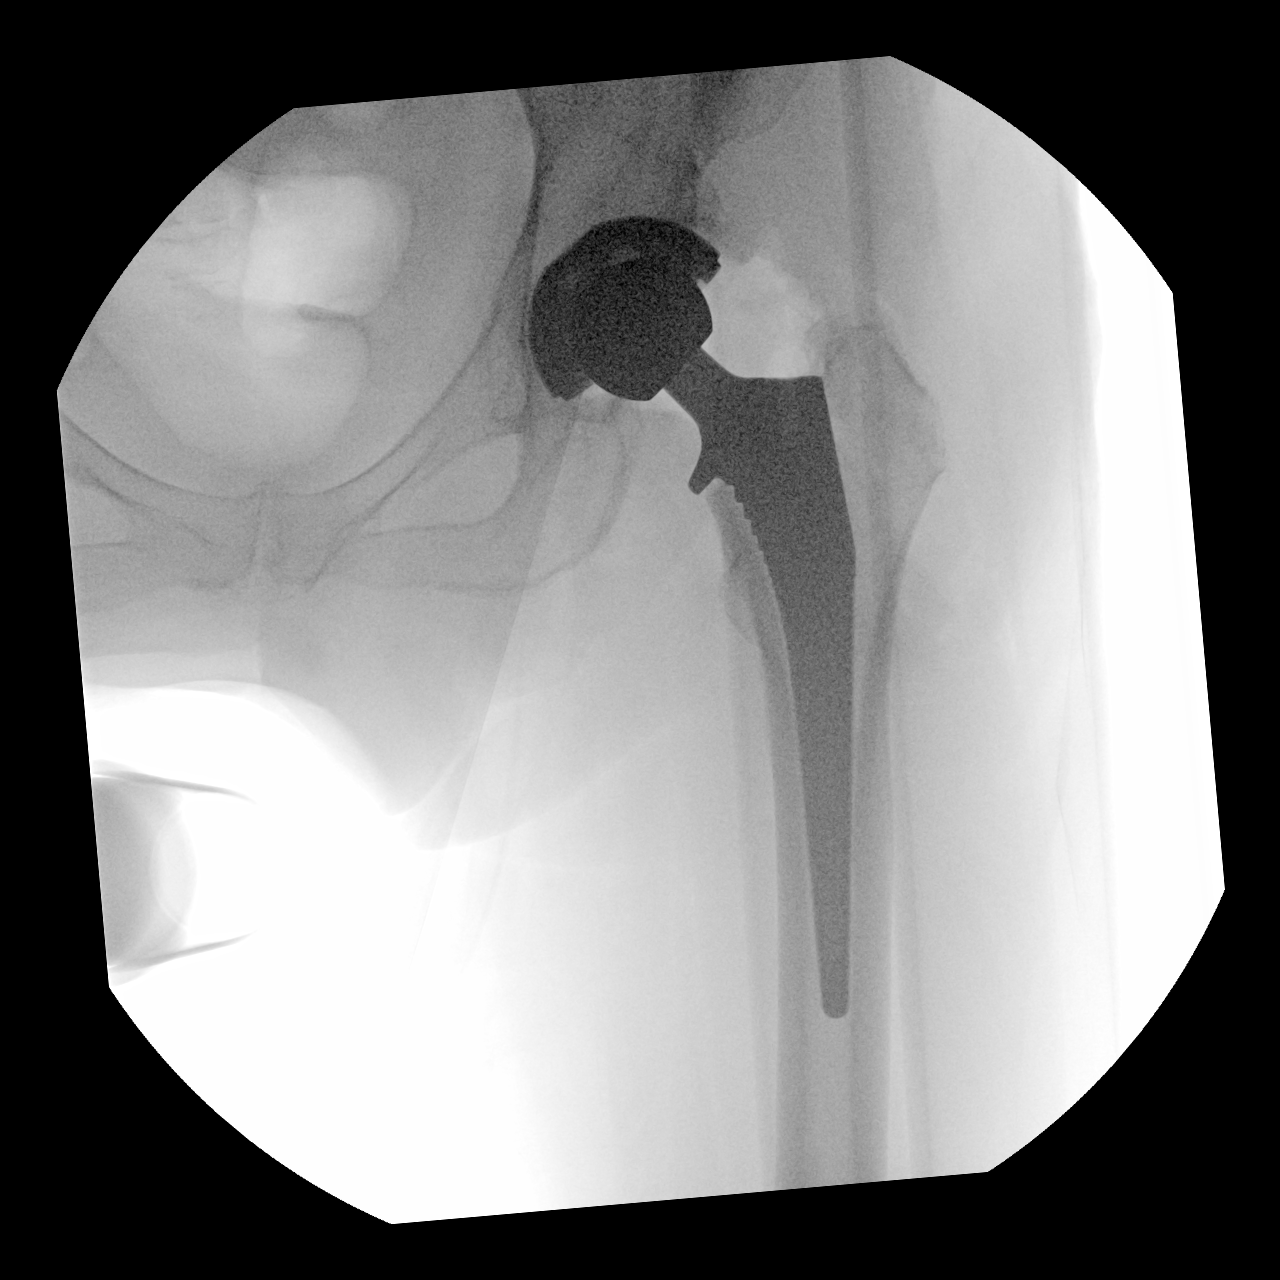
[im 2/3]
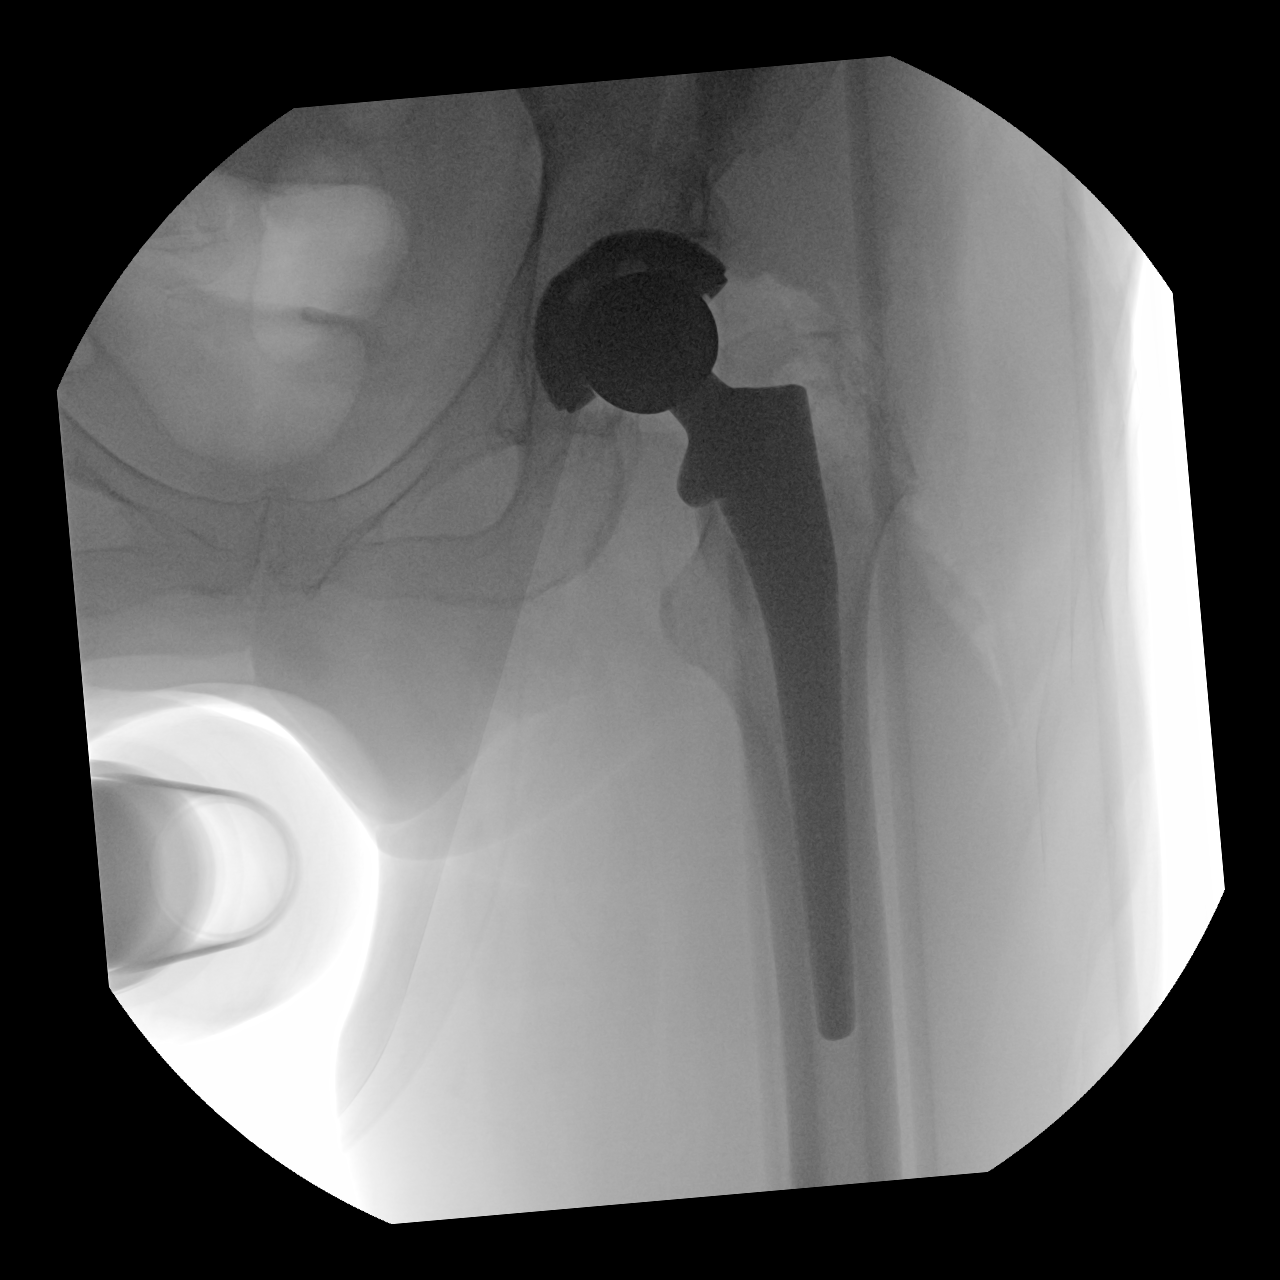
[im 3/3]
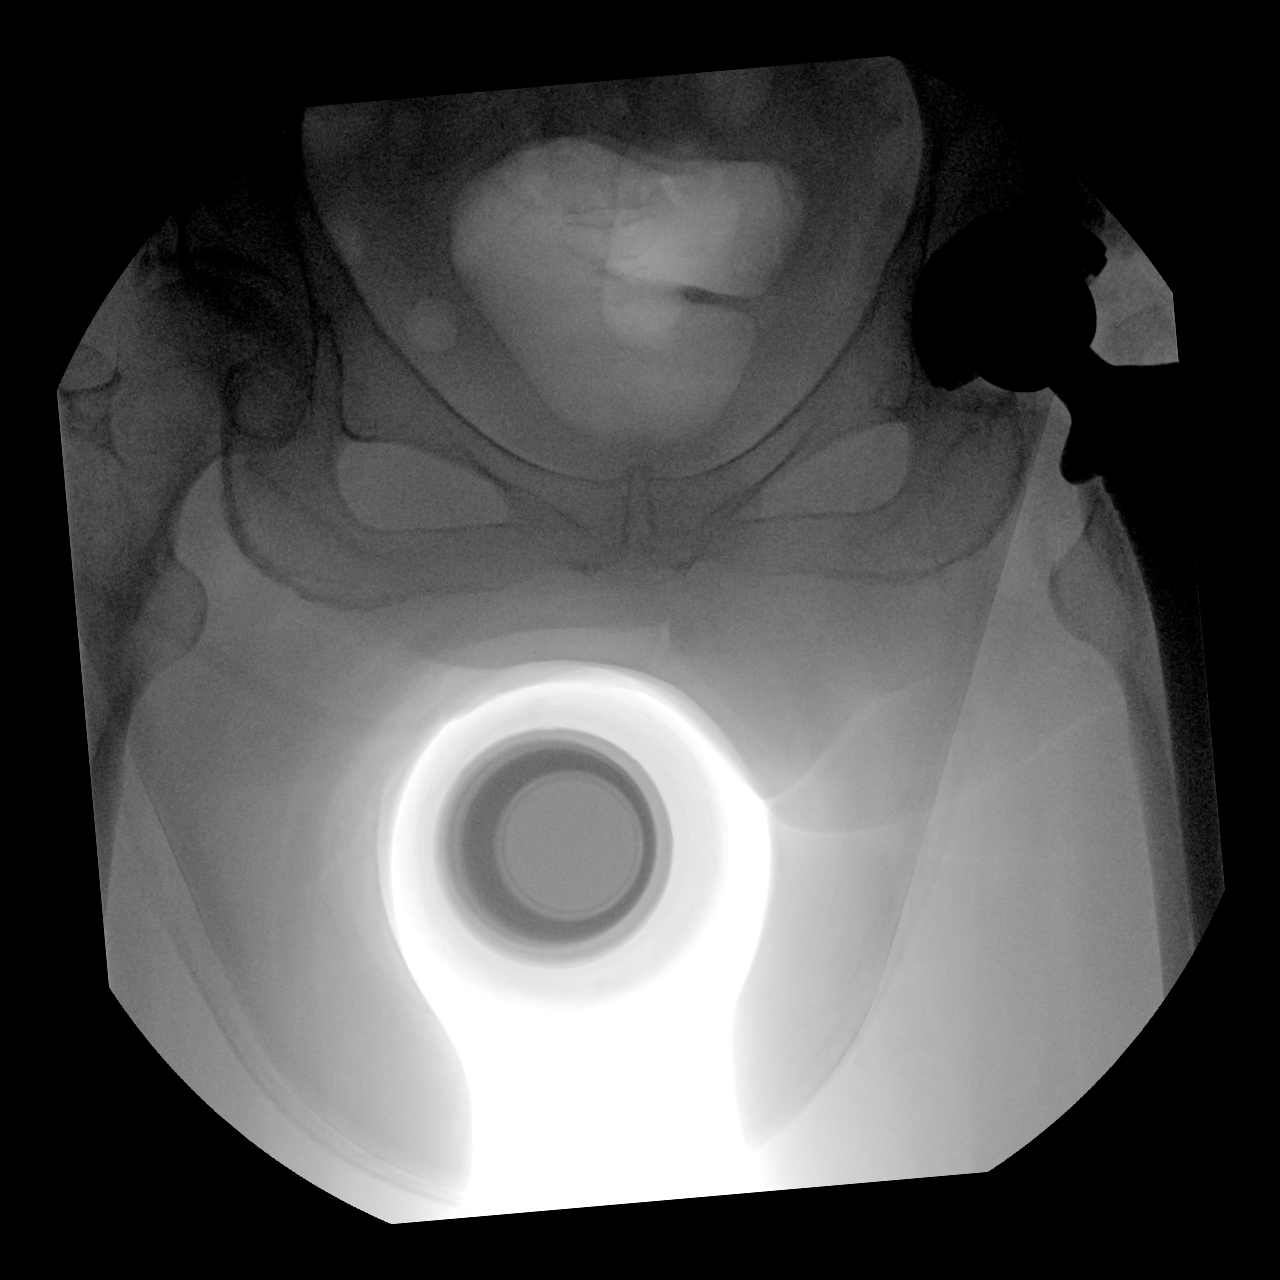

[3 of 3 positions shown; findings below may reference images not displayed]

FINDINGS: Intraoperative images demonstrate left total hip arthroplasty. The
hip appears located on this single view. No adjacent fractures are
present.
IMPRESSION: Left total hip arthroplasty without radiographic evidence for
complication.

## 2022-04-15 ENCOUNTER — Ambulatory Visit (INDEPENDENT_AMBULATORY_CARE_PROVIDER_SITE_OTHER): Payer: Medicare Other | Admitting: Family Medicine

## 2022-04-15 VITALS — BP 150/98 | Ht 65.0 in | Wt 180.0 lb

## 2022-04-15 DIAGNOSIS — J45909 Unspecified asthma, uncomplicated: Secondary | ICD-10-CM

## 2022-04-15 DIAGNOSIS — D649 Anemia, unspecified: Secondary | ICD-10-CM | POA: Diagnosis not present

## 2022-04-15 DIAGNOSIS — E785 Hyperlipidemia, unspecified: Secondary | ICD-10-CM | POA: Insufficient documentation

## 2022-04-15 DIAGNOSIS — I10 Essential (primary) hypertension: Secondary | ICD-10-CM | POA: Diagnosis not present

## 2022-04-15 DIAGNOSIS — K219 Gastro-esophageal reflux disease without esophagitis: Secondary | ICD-10-CM | POA: Diagnosis not present

## 2022-04-15 DIAGNOSIS — R739 Hyperglycemia, unspecified: Secondary | ICD-10-CM

## 2022-04-15 DIAGNOSIS — Z780 Asymptomatic menopausal state: Secondary | ICD-10-CM

## 2022-04-15 MED ORDER — LOSARTAN POTASSIUM-HCTZ 100-25 MG PO TABS: 1.0000 | ORAL_TABLET | Freq: Every day | ORAL | 3 refills | Status: DC

## 2022-04-15 NOTE — Patient Instructions (Signed)
Labs today.  I have changed your Losartan/HCTZ.  Dexa ordered.  Follow up in 3 months.

## 2022-04-16 ENCOUNTER — Telehealth: Payer: Self-pay | Admitting: *Deleted

## 2022-04-16 NOTE — Telephone Encounter (Signed)
Pharmacist from [place] called to clarify losartan for patient. Pharmacist states the patient(who is his mother) was taking the Losartan/hctz 100/12.5 mg in the am and plain Losartan 50 mg at bedtime and wanted to clarify if she continues the '50mg'$  dose at bedtime with the new losartan/hctz dose   Consult with Dr Nicki Reaper:  STOP the Losartan '50mg'$  at bedtime and take the Losartan '100mg'$ /'25mg'$  in morning and call on Monday to schedule follow up next week with Dr Lacinda Axon to reassess blood pressure and discuss other options

## 2022-04-20 DIAGNOSIS — J45909 Unspecified asthma, uncomplicated: Secondary | ICD-10-CM | POA: Insufficient documentation

## 2022-04-20 NOTE — Assessment & Plan Note (Signed)
Has been seen by pulmonologist in [place].  Currently on no pharmacotherapy.  Questionable history of bronchiectasis.  Need records.  Currently stable.

## 2022-04-20 NOTE — Assessment & Plan Note (Signed)
Assessment lipid panel.  Unsure of status.

## 2022-04-20 NOTE — Assessment & Plan Note (Signed)
BP mildly elevated today.  Advised to check at home.  I increased her losartan/HCTZ.  Labs ordered.

## 2022-04-20 NOTE — Progress Notes (Signed)
Subjective:  Patient ID: Holly Cruz, female    DOB: November 13, 1940  Age: 81 y.o. MRN: [id number]  CC: Chief Complaint  Patient presents with   Establish Care    Follow up on blood pressure     HPI: 81 year old female presents to establish care.  Patient's blood pressure is mildly elevated today.  She is on amlodipine, atenolol, losartan and HCTZ.  Needs medication refill on losartan/HCTZ.  Patient was previously seen by physician at [facility].  No recent labs available.  Has a history of TIA.  Is currently on aspirin therapy.  The chart reflects that she has a history of bronchiectasis.  I need records from her pulmonologist.  He is currently on no medication regarding this.  Unsure the status of her lipids.  Currently on simvastatin.  Patient states that she is feeling well and has no particular complaints or concerns at this time.  Need records regarding her vaccines as well.  Will discuss DEXA scan today.  Patient Active Problem List   Diagnosis Date Noted   Asthma 04/20/2022   Essential hypertension 04/15/2022   GERD (gastroesophageal reflux disease) 04/15/2022   Hyperlipidemia 04/15/2022   Status post total replacement of left hip 01/10/2020    Social Hx   Social History   Socioeconomic History   Marital status: Widowed    Spouse name: Not on file   Number of children: Not on file   Years of education: Not on file   Highest education level: Not on file  Occupational History   Not on file  Tobacco Use   Smoking status: Never   Smokeless tobacco: Never  Vaping Use   Vaping Use: Never used  Substance and Sexual Activity   Alcohol use: No   Drug use: Never   Sexual activity: Not on file  Other Topics Concern   Not on file  Social History Narrative   Not on file   Social Determinants of Health   Financial Resource Strain: Not on file  Food Insecurity: Not on file  Transportation Needs: Not on file  Physical Activity: Not on file  Stress: Not on file   Social Connections: Not on file    Review of Systems Per HPI  Objective:  BP (!) 150/98   Ht _0  (1.651 m)   Wt 180 lb (81.6 kg)   BMI 29.95 kg/m      04/15/2022    1:46 PM 04/15/2022    1:11 PM 04/15/2022    1:08 PM  BP/Weight  Systolic BP [phone]  Diastolic BP 98 82 83  Wt. (Lbs)   180  BMI   29.95 kg/m2    Physical Exam Vitals and nursing note reviewed.  Constitutional:      General: She is not in acute distress.    Appearance: Normal appearance.  HENT:     Head: Normocephalic and atraumatic.  Eyes:     General:        Right eye: No discharge.        Left eye: No discharge.     Conjunctiva/sclera: Conjunctivae normal.  Cardiovascular:     Rate and Rhythm: Normal rate and regular rhythm.  Pulmonary:     Effort: Pulmonary effort is normal.     Breath sounds: Normal breath sounds. No wheezing, rhonchi or rales.  Neurological:     Mental Status: She is alert.  Psychiatric:        Mood and Affect: Mood normal.  Behavior: Behavior normal.     Lab Results  Component Value Date   WBC 10.8 (H) 01/11/2020   HGB 11.2 (L) 01/11/2020   HCT 32.5 (L) 01/11/2020   PLT 168 01/11/2020   GLUCOSE 193 (H) 01/11/2020   NA 137 01/11/2020   K 4.0 01/11/2020   CL 103 01/11/2020   CREATININE 0.68 01/11/2020   BUN 15 01/11/2020   CO2 24 01/11/2020     Assessment & Plan:   Problem List Items Addressed This Visit       Cardiovascular and Mediastinum   Essential hypertension - Primary    BP mildly elevated today.  Advised to check at home.  I increased her losartan/HCTZ.  Labs ordered.      Relevant Medications   losartan-hydrochlorothiazide (HYZAAR) 100-25 MG tablet   Other Relevant Orders   CMP14+EGFR     Respiratory   Asthma    Has been seen by pulmonologist in [place].  Currently on no pharmacotherapy.  Questionable history of bronchiectasis.  Need records.  Currently stable.        Digestive   GERD (gastroesophageal reflux disease)      Other   Hyperlipidemia    Assessment lipid panel.  Unsure of status.      Relevant Medications   losartan-hydrochlorothiazide (HYZAAR) 100-25 MG tablet   Other Relevant Orders   Lipid panel   Other Visit Diagnoses     Anemia, unspecified type       Relevant Orders   CBC   Blood glucose elevated       Relevant Orders   Hemoglobin A1c   Postmenopausal       Relevant Orders   DG Bone Density       Meds ordered this encounter  Medications   losartan-hydrochlorothiazide (HYZAAR) 100-25 MG tablet    Sig: Take 1 tablet by mouth daily.    Dispense:  90 tablet    Refill:  3    Follow-up:  Return in about 3 months (around 07/16/2022).  [place]

## 2022-07-17 ENCOUNTER — Ambulatory Visit: Payer: Medicare Other | Admitting: Family Medicine

## 2022-07-23 ENCOUNTER — Telehealth: Payer: Self-pay | Admitting: Family Medicine

## 2022-07-23 NOTE — Telephone Encounter (Signed)
Please remove Dr Lacinda Axon  Thank you

## 2022-07-23 NOTE — Telephone Encounter (Signed)
Called patient to schedule Medicare Annual Wellness Visit (AWV). Left message for patient to call back and schedule Medicare Annual Wellness Visit (AWV).  Last date of AWV: due 05/26/2022 awvs per [facility]  Please schedule an appointment at any time with [facility].  If any questions, please contact me at [phone].  .ste

## 2022-07-23 NOTE — Telephone Encounter (Signed)
Contacted [facility] to schedule their annual wellness visit. Patient declined to schedule AWV at this time.  Patient stated that she went back to her old practice and is no longer a patient with [facility]  Thank you,  Colletta [place],  [place] ??[phone]

## 2022-08-18 LAB — EXTERNAL GENERIC LAB PROCEDURE: COLOGUARD: NEGATIVE

## 2022-08-18 LAB — COLOGUARD: COLOGUARD: NEGATIVE

## 2022-09-14 ENCOUNTER — Ambulatory Visit
Admission: EM | Admit: 2022-09-14 | Discharge: 2022-09-14 | Disposition: A | Discharge status: Home / Self Care | Payer: Medicare Other

## 2022-09-14 ENCOUNTER — Ambulatory Visit (INDEPENDENT_AMBULATORY_CARE_PROVIDER_SITE_OTHER): Payer: Medicare Other

## 2022-09-14 DIAGNOSIS — S8391XA Sprain of unspecified site of right knee, initial encounter: Secondary | ICD-10-CM

## 2022-09-14 DIAGNOSIS — I10 Essential (primary) hypertension: Secondary | ICD-10-CM

## 2022-09-14 MED ORDER — LIDOCAINE 5 % EX OINT: 1.0000 | TOPICAL_OINTMENT | Freq: Two times a day (BID) | CUTANEOUS | 0 refills | Status: DC | PRN

## 2022-09-14 NOTE — Discharge Instructions (Signed)
Your x-ray showed that you have thin bones but was otherwise normal.  There is no evidence of abnormality in the knee or fracture.  I believe that you have injured some of the soft tissue.  Please keep this elevated and use the brace for comfort and support.  You can use over-the-counter medications including Tylenol.  Use lidocaine for additional symptom relief.  You can walk as you can tolerate.  I would recommend some activity in order to prevent stiffness in the joint.  Follow-up with orthopedics if symptoms or not improving.

## 2022-09-14 NOTE — ED Triage Notes (Signed)
Pt reports on Thursday her right knee just started hurting while she was walking and gradually got worst til today. Took tylenol arthritis but no relief. Hurts more with walking and standing.  Pt denies injury

## 2022-09-14 NOTE — ED Provider Notes (Signed)
[facility]    CSN: [id number] Arrival date & time: 09/14/22  1031      History   Chief Complaint Chief Complaint  Patient presents with   Knee Pain    HPI Holly Cruz is a 82 y.o. female.   Patient presents today with a couple day history of worsening right knee pain.  She denies any known injury or increase in activity prior to symptom onset.  She reports that pain began after she was up and active for whole day and went hiking.  She denies any fall or known trigger to symptoms.  Pain is rated 0 at rest but increases to 10 with attempted ambulation, localized to medial right knee with radiation into lower leg, no aggravating or alleviating factors identified.  She has not tried any over-the-counter medication for symptom management.  Denies previous injury or surgery involving her knee.  She denies any popping, clicking, instability.  She is having difficulty ambulating as placing her foot flat increases the pain.  Her blood pressure is elevated today.  She reports that recently her primary care adjusted her medication.  She is monitoring at home and generally this is appropriate with systolic between 657 and 120.  She believes that pain she is experiencing is increasing her blood pressure.  She denies any chest pain, shortness of breath, headache, vision change, dizziness.    Past Medical History:  Diagnosis Date   Abnormal mammogram 2011   Asthma    DJD (degenerative joint disease), cervical    severe muscle spasm   Environmental allergies    GERD (gastroesophageal reflux disease)    Heart burn    Hx of abdominal pain    as per history of present illness gross hematuria with negative workup per Dr. Isabel Caprice 2010- no resurrence  through January 2001   Hyperlipidemia    Hypertension    Insomnia    Left carotid bruit 2015   OAB (overactive bladder)    SOB (shortness of breath)    in past with Neg. cardiac workup with normal myocardial perfusion scan in 02-2005  per Dr. Katrinka Blazing. resolved   TIA (transient ischemic attack) 2003    Patient Active Problem List   Diagnosis Date Noted   Asthma 04/20/2022   Essential hypertension 04/15/2022   GERD (gastroesophageal reflux disease) 04/15/2022   Hyperlipidemia 04/15/2022   Status post total replacement of left hip 01/10/2020    Past Surgical History:  Procedure Laterality Date   carotid doppler     revealed a less than 50% stenosis on the right, none on the left 07-27-2013   CATARACT EXTRACTION W/ INTRAOCULAR LENS  IMPLANT, BILATERAL     COLONOSCOPY     FINGER SURGERY     Right 3rd  and 4th finger- trigger finger release   NASAL SINUS SURGERY     TOTAL HIP ARTHROPLASTY Left 01/10/2020   Procedure: LEFT TOTAL HIP ARTHROPLASTY ANTERIOR APPROACH;  Surgeon: Kathryne Hitch, MD;  Location: [facility];  Service: Orthopedics;  Laterality: Left;    OB History   No obstetric history on file.      Home Medications    Prior to Admission medications   Medication Sig Start Date End Date Taking? Authorizing Provider  latanoprost (XALATAN) 0.005 % ophthalmic solution 1 drop at bedtime. 06/25/22  Yes [provider]  lidocaine (XYLOCAINE) 5 % ointment Apply 1 Application topically 2 (two) times daily as needed. 09/14/22  Yes Tawney Vanorman K, PA-C  amLODipine (NORVASC) 5  MG tablet Take 5 mg by mouth daily.    [provider]  aspirin 81 MG chewable tablet Chew 1 tablet (81 mg total) by mouth 2 (two) times daily. 01/11/20   Kathryne Hitch, MD  estradiol (ESTRACE) 0.1 MG/GM vaginal cream Place 1 Applicatorful vaginally every evening. 12/20/19   [provider]  fluticasone (FLONASE) 50 MCG/ACT nasal spray Place 2 sprays into both nostrils daily as needed for allergies or rhinitis.    [provider]  Multiple Vitamin (MULTIVITAMIN) capsule Take 1 capsule by mouth daily.    [provider]  Omega-3 1000 MG CAPS Take 1,000-2,000 mg by mouth See admin instructions.  Take 1000 mg in the morning and 2000 mg at bedtime    [provider]  omeprazole (PRILOSEC) 20 MG capsule Take 20 mg by mouth daily.     [provider]  PRESCRIPTION MEDICATION Environmental injection every 30 days    [provider]  simvastatin (ZOCOR) 20 MG tablet Take 20 mg by mouth daily.    [provider]  telmisartan-hydrochlorothiazide (MICARDIS HCT) 80-25 MG tablet     [provider]    Family History Family History  Problem Relation Age of Onset   Hypertension Mother    Heart disease Mother    Prostate cancer Father    Hypertension Father     Social History Social History   Tobacco Use   Smoking status: Never   Smokeless tobacco: Never  Vaping Use   Vaping Use: Never used  Substance Use Topics   Alcohol use: No   Drug use: Never     Allergies   Codeine, Sulfa antibiotics, Tetanus toxoids, and Naprosyn [naproxen]   Review of Systems Review of Systems  Constitutional:  Positive for activity change. Negative for appetite change, fatigue and fever.  Eyes:  Negative for visual disturbance.  Respiratory:  Negative for cough and shortness of breath.   Cardiovascular:  Negative for chest pain.  Gastrointestinal:  Negative for abdominal pain, diarrhea, nausea and vomiting.  Musculoskeletal:  Positive for arthralgias and gait problem. Negative for joint swelling and myalgias.  Neurological:  Negative for weakness and numbness.     Physical Exam Triage Vital Signs ED Triage Vitals  Enc Vitals Group     BP 09/14/22 1038 (!) 165/85     Pulse Rate 09/14/22 1038 94     Resp 09/14/22 1038 18     Temp 09/14/22 1038 97.8 F (36.6 C)     Temp Source 09/14/22 1038 Oral     SpO2 09/14/22 1038 95 %     Weight --      Height --      Head Circumference --      Peak Flow --      Pain Score 09/14/22 1042 0     Pain Loc --      Pain Edu? --      Excl. in GC? --    No data found.  Updated Vital Signs BP (!) 145/71 (BP  Location: Right Arm)   Pulse 94   Temp 97.8 F (36.6 C) (Oral)   Resp 18   SpO2 95%   Visual Acuity Right Eye Distance:   Left Eye Distance:   Bilateral Distance:    Right Eye Near:   Left Eye Near:    Bilateral Near:     Physical Exam Vitals reviewed.  Constitutional:      General: She is awake. She is not in acute  distress.    Appearance: Normal appearance. She is well-developed. She is not ill-appearing.     Comments: Very pleasant female appears stated age in no acute distress sitting comfortably in exam room  HENT:     Head: Normocephalic and atraumatic.  Cardiovascular:     Rate and Rhythm: Normal rate and regular rhythm.     Heart sounds: Normal heart sounds, S1 normal and S2 normal. No murmur heard. Pulmonary:     Effort: Pulmonary effort is normal.     Breath sounds: Normal breath sounds. No wheezing, rhonchi or rales.     Comments: Clear to auscultation bilaterally Musculoskeletal:     Right knee: No swelling or deformity. Normal range of motion. Tenderness present over the medial joint line. No LCL laxity, MCL laxity, ACL laxity or PCL laxity.     Instability Tests: Anterior drawer test negative. Posterior drawer test negative.     Comments: Right knee: Tenderness to palpation over medial joint line without deformity or effusion.  Normal active range of motion.  No ligamentous laxity on exam.  Strength 5/5 bilateral lower extremities.  Psychiatric:        Behavior: Behavior is cooperative.      UC Treatments / Results  Labs (all labs ordered are listed, but only abnormal results are displayed) Labs Reviewed - No data to display  EKG   Radiology DG Knee Complete 4 Views Right  Result Date: 09/14/2022 CLINICAL DATA:  Pain.  Difficulty ambulating. EXAM: RIGHT KNEE - COMPLETE 4 VIEW COMPARISON:  None Available. FINDINGS: Right knee is located. No acute or healing fracture is present. No significant effusion is present. Moderate osteopenia is noted.  IMPRESSION: 1. Moderate osteopenia. 2. No acute or healing fracture. Electronically Signed   By: Marin Roberts M.D.   On: 09/14/2022 11:26    Procedures Procedures (including critical care time)  Medications Ordered in UC Medications - No data to display  Initial Impression / Assessment and Plan / UC Course  I have reviewed the triage vital signs and the nursing notes.  Pertinent labs & imaging results that were available during my care of the patient were reviewed by me and considered in my medical decision making (see chart for details).     X-ray was obtained given tenderness over joint line which showed no acute osseous abnormality but did show osteopenia.  Discussed sprain as etiology of symptoms.  Patient is unable to tolerate NSAIDs so can use over-the-counter acetaminophen for pain relief.  She was given topical lidocaine to help manage her symptoms.  We discussed that she should use a brace for comfort and support and avoid strenuous activity including prolonged ambulation.  Recommend she participate in light activity in order to prevent stiffness.  If her symptoms or not improving quickly she should follow-up with orthopedics and was given contact information for local provider with instruction to call to schedule an appointment.  If anything worsens or changes she is to return for reevaluation.  Blood pressure was initially elevated but did improve during her visit.  She is monitoring this at home and reports it is generally at goal.  She denies any signs/symptoms of endorgan damage.  We discussed that she should continue with her prescribed antihypertensive medication and monitor her blood pressure at home.  If this is persistently above 140/90 she should return to her primary care or our clinic for medication adjustment.  If she develops any chest pain, shortness of breath, headache, vision change in the setting  of high blood pressure she needs to be seen immediately.  Final  Clinical Impressions(s) / UC Diagnoses   Final diagnoses:  Sprain of right knee, unspecified ligament, initial encounter  Elevated blood pressure reading with diagnosis of hypertension     Discharge Instructions      Your x-ray showed that you have thin bones but was otherwise normal.  There is no evidence of abnormality in the knee or fracture.  I believe that you have injured some of the soft tissue.  Please keep this elevated and use the brace for comfort and support.  You can use over-the-counter medications including Tylenol.  Use lidocaine for additional symptom relief.  You can walk as you can tolerate.  I would recommend some activity in order to prevent stiffness in the joint.  Follow-up with orthopedics if symptoms or not improving.     ED Prescriptions     Medication Sig Dispense Auth. Provider   lidocaine (XYLOCAINE) 5 % ointment Apply 1 Application topically 2 (two) times daily as needed. 100 g Shawnise Peterkin K, PA-C      PDMP not reviewed this encounter.   Jeani Hawking, PA-C 09/14/22 1139

## 2022-09-30 ENCOUNTER — Ambulatory Visit (INDEPENDENT_AMBULATORY_CARE_PROVIDER_SITE_OTHER): Payer: Medicare Other | Admitting: Orthopedic Surgery

## 2022-09-30 ENCOUNTER — Encounter: Payer: Self-pay | Admitting: Orthopedic Surgery

## 2022-09-30 DIAGNOSIS — M79661 Pain in right lower leg: Secondary | ICD-10-CM

## 2022-09-30 NOTE — Progress Notes (Signed)
New Patient Visit  Assessment: Holly Cruz is a 82 y.o. female with the following: 1. Right calf pain  Plan: Holly Cruz has pain in the right calf.  On physical exam, there is no redness.  No swelling.  No bruising.  She denies a specific injury.  She states she started to have some pain the day after she was especially active.  Since the onset of pain, it has persisted, but has improved.  She uses a knee brace, which helps with some of the discomfort around her knee.  She is taking Tylenol and ibuprofen.  She has tried some topical treatments.  I had a frank discussion with her in clinic today.  There is nothing concerning on physical exam.  There is no obvious injury that could explain her findings.  We briefly discussed physical therapy, but she is not interested in trying this.  She can continue with heat or ice, or combination of both.  Activities as tolerated.  She does not need scheduled follow-up.  Follow-up: Return if symptoms worsen or fail to improve.  Subjective:  Chief Complaint  Patient presents with   Knee Pain    R knee pain for 2 wks     History of Present Illness: Holly Cruz is a 82 y.o. female who presents for evaluation of right leg pain.  She has had pain in the right lower leg for the past 2 weeks.  No specific injury.  She states that she was especially active the day before the onset of pain.  However, she reports that she is often very active.  She started have severe pain in the right lower leg.  She presented to an urgent care center.  X-rays were negative.  Since then, she has gradually gotten better, but still has some difficulty when ambulating.  She has no pain when she is sitting still.  The pain starts with weightbearing.   Review of Systems: No fevers or chills No numbness or tingling No chest pain No shortness of breath No bowel or bladder dysfunction No GI distress No headaches   Medical History:  Past Medical History:  Diagnosis  Date   Abnormal mammogram 2011   Asthma    DJD (degenerative joint disease), cervical    severe muscle spasm   Environmental allergies    GERD (gastroesophageal reflux disease)    Heart burn    Hx of abdominal pain    as per history of present illness gross hematuria with negative workup per Dr. Isabel Caprice 2010- no resurrence  through January 2001   Hyperlipidemia    Hypertension    Insomnia    Left carotid bruit 2015   OAB (overactive bladder)    SOB (shortness of breath)    in past with Neg. cardiac workup with normal myocardial perfusion scan in 02-2005 per Dr. Katrinka Blazing. resolved   TIA (transient ischemic attack) 2003    Past Surgical History:  Procedure Laterality Date   carotid doppler     revealed a less than 50% stenosis on the right, none on the left 07-27-2013   CATARACT EXTRACTION W/ INTRAOCULAR LENS  IMPLANT, BILATERAL     COLONOSCOPY     FINGER SURGERY     Right 3rd  and 4th finger- trigger finger release   NASAL SINUS SURGERY     TOTAL HIP ARTHROPLASTY Left 01/10/2020   Procedure: LEFT TOTAL HIP ARTHROPLASTY ANTERIOR APPROACH;  Surgeon: Kathryne Hitch, MD;  Location: [facility];  Service: Orthopedics;  Laterality: Left;    Family History  Problem Relation Age of Onset   Hypertension Mother    Heart disease Mother    Prostate cancer Father    Hypertension Father    Social History   Tobacco Use   Smoking status: Never   Smokeless tobacco: Never  Vaping Use   Vaping Use: Never used  Substance Use Topics   Alcohol use: No   Drug use: Never    Allergies  Allergen Reactions   Codeine     Nausea and vomiting   Sulfa Antibiotics     rash   Tetanus Toxoids     Rash   Naprosyn [Naproxen] Swelling and Rash    Some form of severe reaction    Current Meds  Medication Sig   amLODipine (NORVASC) 5 MG tablet Take 5 mg by mouth daily.   aspirin 81 MG chewable tablet Chew 1 tablet (81 mg total) by mouth 2 (two) times daily.   estradiol (ESTRACE) 0.1 MG/GM  vaginal cream Place 1 Applicatorful vaginally every evening.   fluticasone (FLONASE) 50 MCG/ACT nasal spray Place 2 sprays into both nostrils daily as needed for allergies or rhinitis.   latanoprost (XALATAN) 0.005 % ophthalmic solution 1 drop at bedtime.   Multiple Vitamin (MULTIVITAMIN) capsule Take 1 capsule by mouth daily.   Omega-3 1000 MG CAPS Take 1,000-2,000 mg by mouth See admin instructions. Take 1000 mg in the morning and 2000 mg at bedtime   omeprazole (PRILOSEC) 20 MG capsule Take 20 mg by mouth daily.    PRESCRIPTION MEDICATION Environmental injection every 30 days   simvastatin (ZOCOR) 20 MG tablet Take 20 mg by mouth daily.   telmisartan-hydrochlorothiazide (MICARDIS HCT) 80-25 MG tablet     Objective: There were no vitals taken for this visit.  Physical Exam:  General: Elderly female., Alert and oriented., and No acute distress. Gait: Right sided antalgic gait.  Evaluation of the right knee and lower leg demonstrates no swelling.  No redness.  No bruising.  Mild tenderness to palpation within the right calf muscle, in the medial aspect.  Mild tenderness palpation along the medial joint line.  Mild tenderness to palpation along the pes anserine tendons.  She has good range of motion.  Her knee is stable.  No swelling in the knee.  IMAGING: I personally reviewed images previously obtained from the ED  X-rays were previously obtained.  Mild loss of joint space within the medial compartment.  Minimal osteophytes.   New Medications:  No orders of the defined types were placed in this encounter.     Oliver Barre, MD  09/30/2022 4:22 PM

## 2023-02-17 ENCOUNTER — Encounter: Payer: Self-pay | Admitting: Orthopedic Surgery

## 2023-02-17 ENCOUNTER — Ambulatory Visit (INDEPENDENT_AMBULATORY_CARE_PROVIDER_SITE_OTHER): Payer: Medicare Other | Admitting: Orthopedic Surgery

## 2023-02-17 VITALS — BP 154/79 | HR 80 | Ht 65.0 in | Wt 179.0 lb

## 2023-02-17 DIAGNOSIS — R0781 Pleurodynia: Secondary | ICD-10-CM | POA: Diagnosis not present

## 2023-02-17 DIAGNOSIS — M79661 Pain in right lower leg: Secondary | ICD-10-CM | POA: Diagnosis not present

## 2023-02-17 NOTE — Progress Notes (Signed)
Return Patient Visit  Assessment: Holly Cruz is a 82 y.o. female with the following: 1. Right calf pain 2.  Pain in ribs  Plan: CHADWICK YUEN has had a return of pain in the right calf.  She states it never really improved.  She saw her PCP recently, who gave her a short course of prednisone.  This has helped her pain.  She may be developing a Bakers cyst, which could be irritating the medial head of the gastroc, depending on the location of the cyst.  If this continues, I have urged her to return to clinic.  At that time, we could consider an ultrasound of the posterior knee for evaluation of a cyst.  In regards to her ribs, she has not had a fall, so there is low likelihood of the fracture.  Her description of the pain is consistent with a spasm.  I have offered her a muscle relaxer, but she states she has a prescription from prior surgery.  She is willing to try that.  If she has any further issues, she will contact the clinic.  Otherwise, follow-up as needed.  Follow-up: Return if symptoms worsen or fail to improve.  Subjective:  Chief Complaint  Patient presents with   Leg Pain    R leg feeling much since Apr '24    Back Pain    Mid back pain since leg since her back started hurting.     History of Present Illness: Holly Cruz is a 82 y.o. female who returns for evaluation of right leg pain.  Since I saw her in April, she states the pain has not really changed.  She continued to have issues.  It was affecting her walking.  She went to see her PCP a week ago, and she was given some prednisone.  Since starting the prednisone, and has really improved the symptoms in her right leg.  However, she is about to stop taking the prednisone.  In addition, she notes that she has pain starting in the mid back, radiating anteriorly in both directions.  No specific injury.  She does report a fall, and similar type pains approximately 2 years ago.  However, recently, she has not had a fall or  stumble.  Review of Systems: No fevers or chills No numbness or tingling No chest pain No shortness of breath No bowel or bladder dysfunction No GI distress No headaches    Objective: BP (!) 154/79   Pulse 80   Ht 5\' 5"  (1.651 m)   Wt 179 lb (81.2 kg)   BMI 29.79 kg/m   Physical Exam:  General: Elderly female., Alert and oriented., and No acute distress. Gait: Normal gait.  Right knee without swelling.  No redness.  Mild tenderness to palpation of the medial gastroc.  There is some fullness within the posterior knee.  No obvious cyst.  Good range of motion of the right knee.  Knee is stable to varus and valgus stress.  Negative Lachman.  Mild tenderness to palpation in the mid back, radiating in both directions.  No point tenderness.  No redness.  No bruising.  IMAGING: No new imaging obtained today   New Medications:  No orders of the defined types were placed in this encounter.     Oliver Barre, MD  02/17/2023 2:36 PM

## 2023-04-27 ENCOUNTER — Other Ambulatory Visit: Payer: Self-pay

## 2023-04-27 ENCOUNTER — Encounter: Payer: Self-pay | Admitting: Family Medicine

## 2023-04-27 ENCOUNTER — Ambulatory Visit (INDEPENDENT_AMBULATORY_CARE_PROVIDER_SITE_OTHER): Payer: Medicare Other | Admitting: Family Medicine

## 2023-04-27 VITALS — BP 145/67 | Ht 65.0 in | Wt 175.0 lb

## 2023-04-27 DIAGNOSIS — M25561 Pain in right knee: Secondary | ICD-10-CM | POA: Diagnosis present

## 2023-04-27 NOTE — Progress Notes (Signed)
PCP: Thana Ates, MD  Subjective:   HPI: Patient is a 82 y.o. female here for right leg pain.  Patient reports since April she's had pain in her right calf. No acute injury or trauma. Pain started in mid medial gastroc area with some swelling later. Tried a knee brace, heat without much benefit. She had a prednisone dose pack which helped tremendously though pain returned. Able to go for a 30 minute walk but next day pain is very bad. Now involves whole calf muscle. No knee pain or radiation from her low back.  Past Medical History:  Diagnosis Date   Abnormal mammogram 2011   Asthma    DJD (degenerative joint disease), cervical    severe muscle spasm   Environmental allergies    GERD (gastroesophageal reflux disease)    Heart burn    Hx of abdominal pain    as per history of present illness gross hematuria with negative workup per Dr. Isabel Caprice 2010- no resurrence  through January 2001   Hyperlipidemia    Hypertension    Insomnia    Left carotid bruit 2015   OAB (overactive bladder)    SOB (shortness of breath)    in past with Neg. cardiac workup with normal myocardial perfusion scan in 02-2005 per Dr. Katrinka Blazing. resolved   TIA (transient ischemic attack) 2003    Current Outpatient Medications on File Prior to Visit  Medication Sig Dispense Refill   amLODipine (NORVASC) 5 MG tablet Take 5 mg by mouth daily.     aspirin 81 MG chewable tablet Chew 1 tablet (81 mg total) by mouth 2 (two) times daily. 30 tablet 0   estradiol (ESTRACE) 0.1 MG/GM vaginal cream Place 1 Applicatorful vaginally every evening.     fluticasone (FLONASE) 50 MCG/ACT nasal spray Place 2 sprays into both nostrils daily as needed for allergies or rhinitis.     latanoprost (XALATAN) 0.005 % ophthalmic solution 1 drop at bedtime.     Multiple Vitamin (MULTIVITAMIN) capsule Take 1 capsule by mouth daily.     Omega-3 1000 MG CAPS Take 1,000-2,000 mg by mouth See admin instructions. Take 1000 mg in the morning and  2000 mg at bedtime     omeprazole (PRILOSEC) 20 MG capsule Take 20 mg by mouth daily.      PRESCRIPTION MEDICATION Environmental injection every 30 days     simvastatin (ZOCOR) 20 MG tablet Take 20 mg by mouth daily.     telmisartan-hydrochlorothiazide (MICARDIS HCT) 80-25 MG tablet      No current facility-administered medications on file prior to visit.    Past Surgical History:  Procedure Laterality Date   carotid doppler     revealed a less than 50% stenosis on the right, none on the left 07-27-2013   CATARACT EXTRACTION W/ INTRAOCULAR LENS  IMPLANT, BILATERAL     COLONOSCOPY     FINGER SURGERY     Right 3rd  and 4th finger- trigger finger release   NASAL SINUS SURGERY     TOTAL HIP ARTHROPLASTY Left 01/10/2020   Procedure: LEFT TOTAL HIP ARTHROPLASTY ANTERIOR APPROACH;  Surgeon: Kathryne Hitch, MD;  Location: [facility];  Service: Orthopedics;  Laterality: Left;    Allergies  Allergen Reactions   Codeine     Nausea and vomiting   Sulfa Antibiotics     rash   Tetanus Toxoids     Rash   Naprosyn [Naproxen] Swelling and Rash    Some form of severe reaction  BP (!) 145/67   Ht 5\' 5"  (1.651 m)   Wt 175 lb (79.4 kg)   BMI 29.12 kg/m       No data to display              No data to display              Objective:  Physical Exam:  Gen: NAD, comfortable in exam room  Right leg: No deformity, swelling, bruising. FROM with 5/5 strength knee and ankle without reproduction of pain. Tenderness to palpation medial > lateral gastroc.  No other tenderness including knee. NVI distally. Negative thompsons.  Limited MSK u/s right leg:  No visible tears of medial or lateral gastroc.  Small baker's cyst.  All venous structures compressible.   Assessment & Plan:  1. Right leg pain - consistent with overuse strain of calf.  Has not had any rehab for this to date - start with home exercises, compression sleeve, heat.  Consider formal physical therapy if not  improving.  Discussed how to advance her walking to minimize increase in pain.  She is not a smoker and distal pulses excellent - unlikely an arterial issue.  F/u in 6 weeks.

## 2023-04-27 NOTE — Patient Instructions (Signed)
You have an overuse calf strain. Compression sleeve during the day only when up and walking around. Heat 15 minutes at a time as needed. Shoes with a natural heel lift can help rest this also. Tylenol as needed for pain. The rehab exercises are very important - try to do these daily. Consider formal physical therapy if you're not improving as expected - you don't need to wait to follow up with me if you're struggling and want to do this - just call me or send a mychart message. Start with walking 1/4 mile on a track/flat surface and increase as tolerated by 1/4 mile each day. Follow up with me in 6 weeks.

## 2023-05-25 ENCOUNTER — Ambulatory Visit (INDEPENDENT_AMBULATORY_CARE_PROVIDER_SITE_OTHER): Payer: Medicare Other | Admitting: Family Medicine

## 2023-05-25 VITALS — BP 124/76 | Ht 65.0 in | Wt 175.0 lb

## 2023-05-25 DIAGNOSIS — M25561 Pain in right knee: Secondary | ICD-10-CM

## 2023-05-25 NOTE — Patient Instructions (Signed)
Keep up the good work with your exercises! Do these for 6 more weeks even if you feel completely better. Try bike or exercise in the pool while you continue to rehab this. Follow up with me as needed.

## 2023-05-26 ENCOUNTER — Encounter: Payer: Self-pay | Admitting: Family Medicine

## 2023-11-18 ENCOUNTER — Other Ambulatory Visit: Payer: Self-pay | Admitting: Gynecology

## 2023-11-18 DIAGNOSIS — R928 Other abnormal and inconclusive findings on diagnostic imaging of breast: Secondary | ICD-10-CM

## 2023-11-26 ENCOUNTER — Ambulatory Visit
Admission: RE | Admit: 2023-11-26 | Discharge: 2023-11-26 | Disposition: A | Discharge status: Home / Self Care | Source: Ambulatory Visit | Attending: Gynecology | Admitting: Gynecology

## 2023-11-26 ENCOUNTER — Other Ambulatory Visit: Payer: Self-pay | Admitting: Gynecology

## 2023-11-26 DIAGNOSIS — R928 Other abnormal and inconclusive findings on diagnostic imaging of breast: Secondary | ICD-10-CM

## 2024-06-06 NOTE — Progress Notes (Unsigned)
 "  New Patient Pulmonology Office Visit   Subjective:  Patient ID: Holly Cruz, female    DOB: 06-13-1940  MRN: [id number]  Referred by: Dwight Trula SQUIBB, MD  CC: No chief complaint on file.   HPI Holly Cruz is a 84 y.o. female with hx of GERD, HTN, HLD and Asthma who presents to establish care.    {PULM QUESTIONNAIRES (Optional):33196}  ROS  Allergies: Codeine, Sulfa antibiotics, Tetanus toxoid-containing vaccines, and Naprosyn [naproxen] Current Medications[1] Past Medical History:  Diagnosis Date   Abnormal mammogram 2011   Asthma    DJD (degenerative joint disease), cervical    severe muscle spasm   Environmental allergies    GERD (gastroesophageal reflux disease)    Heart burn    Hx of abdominal pain    as per history of present illness gross hematuria with negative workup per Dr. Alline 2010- no resurrence  through January 2001   Hyperlipidemia    Hypertension    Insomnia    Left carotid bruit 2015   OAB (overactive bladder)    SOB (shortness of breath)    in past with Neg. cardiac workup with normal myocardial perfusion scan in 02-2005 per Dr. Claudene. resolved   TIA (transient ischemic attack) 2003   Past Surgical History:  Procedure Laterality Date   carotid doppler     revealed a less than 50% stenosis on the right, none on the left 07-27-2013   CATARACT EXTRACTION W/ INTRAOCULAR LENS  IMPLANT, BILATERAL     COLONOSCOPY     FINGER SURGERY     Right 3rd  and 4th finger- trigger finger release   NASAL SINUS SURGERY     TOTAL HIP ARTHROPLASTY Left 01/10/2020   Procedure: LEFT TOTAL HIP ARTHROPLASTY ANTERIOR APPROACH;  Surgeon: Vernetta Lonni GRADE, MD;  Location: [facility];  Service: Orthopedics;  Laterality: Left;   Family History  Problem Relation Age of Onset   Hypertension Mother    Heart disease Mother    Prostate cancer Father    Hypertension Father    Social History   Socioeconomic History   Marital status: Widowed    Spouse name: Not on  file   Number of children: Not on file   Years of education: Not on file   Highest education level: Not on file  Occupational History   Not on file  Tobacco Use   Smoking status: Never   Smokeless tobacco: Never  Vaping Use   Vaping status: Never Used  Substance and Sexual Activity   Alcohol use: No   Drug use: Never   Sexual activity: Not on file  Other Topics Concern   Not on file  Social History Narrative   Not on file   Social Drivers of Health   Tobacco Use: Low Risk (05/26/2023)   Patient History    Smoking Tobacco Use: Never    Smokeless Tobacco Use: Never    Passive Exposure: Not on file  Financial Resource Strain: Not on file  Food Insecurity: Not on file  Transportation Needs: Not on file  Physical Activity: Not on file  Stress: Not on file  Social Connections: Not on file  Intimate Partner Violence: Not on file  Depression (PHQ2-9): Low Risk (04/15/2022)   Depression (PHQ2-9)    PHQ-2 Score: 0  Alcohol Screen: Not on file  Housing: Not on file  Utilities: Not on file  Health Literacy: Not on file       Objective:  There were no vitals  taken for this visit. {Pulm Vitals (Optional):32837}  Physical Exam  Diagnostic Review:  {Labs (Optional):32838}     Assessment & Plan:   Assessment & Plan   No orders of the defined types were placed in this encounter.     No follow-ups on file.   Callee Rohrig, MD    [1]  Current Outpatient Medications:    amLODipine  (NORVASC ) 5 MG tablet, Take 5 mg by mouth daily., Disp: , Rfl:    aspirin  81 MG chewable tablet, Chew 1 tablet (81 mg total) by mouth 2 (two) times daily., Disp: 30 tablet, Rfl: 0   estradiol (ESTRACE) 0.1 MG/GM vaginal cream, Place 1 Applicatorful vaginally every evening., Disp: , Rfl:    fluticasone  (FLONASE ) 50 MCG/ACT nasal spray, Place 2 sprays into both nostrils daily as needed for allergies or rhinitis., Disp: , Rfl:    latanoprost (XALATAN) 0.005 % ophthalmic solution, 1 drop  at bedtime., Disp: , Rfl:    Multiple Vitamin (MULTIVITAMIN) capsule, Take 1 capsule by mouth daily., Disp: , Rfl:    Omega-3 1000 MG CAPS, Take 1,000-2,000 mg by mouth See admin instructions. Take 1000 mg in the morning and 2000 mg at bedtime, Disp: , Rfl:    omeprazole (PRILOSEC) 20 MG capsule, Take 20 mg by mouth daily. , Disp: , Rfl:    PRESCRIPTION MEDICATION, Environmental injection every 30 days, Disp: , Rfl:    simvastatin  (ZOCOR ) 20 MG tablet, Take 20 mg by mouth daily., Disp: , Rfl:    telmisartan-hydrochlorothiazide  (MICARDIS HCT) 80-25 MG tablet, , Disp: , Rfl:   "

## 2024-06-07 ENCOUNTER — Ambulatory Visit (INDEPENDENT_AMBULATORY_CARE_PROVIDER_SITE_OTHER): Admitting: Pulmonary Disease

## 2024-06-07 ENCOUNTER — Encounter (HOSPITAL_BASED_OUTPATIENT_CLINIC_OR_DEPARTMENT_OTHER): Payer: Self-pay | Admitting: Pulmonary Disease

## 2024-06-07 VITALS — BP 138/82 | HR 88 | Ht 65.0 in | Wt 179.3 lb

## 2024-06-07 DIAGNOSIS — J4520 Mild intermittent asthma, uncomplicated: Secondary | ICD-10-CM

## 2024-06-07 DIAGNOSIS — J301 Allergic rhinitis due to pollen: Secondary | ICD-10-CM | POA: Diagnosis not present

## 2024-06-07 NOTE — Assessment & Plan Note (Signed)
 Mild intermittent asthma Cough and wheezing triggered by exercise, heat, temperature changes, and allergens. Symptoms improved with allergy shots and Symbicort previously. Not a lot of inhalers are covered by insurance. She will discuss with pharmacist son regarding insurance coverage and then message me on mychart. I asked her to look for ICS/LABA. Suspect Ranell is covered. -  Consider Breo inhaler, two puffs daily, rinse mouth after use. - Ordered PFTs in [place]. - Advised to discuss with Dr. Stuart, allergy and immunology, about resuming allergy shots. I advised her to bring allergy testing from Dr. Janit to Dr. Karl office. - Instructed to contact via MyChart regarding ICS/LABA.

## 2024-06-07 NOTE — Patient Instructions (Signed)
 Please ask your son to look for an inhaler that has an inhaled corticosteroid and long acting beta agonist in it that's covered by insurance and I'm happy to put that in.   VISIT SUMMARY: During your visit, we discussed your persistent coughing and allergy management. You have a history of bronchiectasis and severe allergies, which have been managed with allergy shots and medications in the past.  YOUR PLAN: MILD INTERMITTENT ASTHMA: Your coughing and wheezing are triggered by exercise, heat, temperature changes, and allergens. Symptoms have improved with allergy shots and Symbicort in the past. -Start using the Breo inhaler, two puffs daily, and rinse your mouth after use. -A breathing test has been ordered in [place]. -Discuss with Doctor Stuart about resuming allergy shots and bring your previous test results. -If Ranell is too expensive, contact us  via MyChart for alternatives.  Contains text generated by [employer].

## 2024-09-19 ENCOUNTER — Ambulatory Visit: Admitting: Pulmonary Disease
